# Patient Record
Sex: Male | Born: 1950 | Race: White | Hispanic: No | Marital: Married | Smoking: Current every day smoker
Health system: Southern US, Community
[De-identification: ages and names within clinical notes are randomized; demographics above are authoritative.]

## PROBLEM LIST (undated history)

## (undated) DIAGNOSIS — M1990 Unspecified osteoarthritis, unspecified site: Secondary | ICD-10-CM

## (undated) DIAGNOSIS — E119 Type 2 diabetes mellitus without complications: Secondary | ICD-10-CM

## (undated) DIAGNOSIS — K759 Inflammatory liver disease, unspecified: Secondary | ICD-10-CM

## (undated) DIAGNOSIS — I639 Cerebral infarction, unspecified: Secondary | ICD-10-CM

## (undated) DIAGNOSIS — E785 Hyperlipidemia, unspecified: Secondary | ICD-10-CM

## (undated) DIAGNOSIS — J449 Chronic obstructive pulmonary disease, unspecified: Secondary | ICD-10-CM

## (undated) DIAGNOSIS — G2581 Restless legs syndrome: Secondary | ICD-10-CM

## (undated) DIAGNOSIS — T8859XA Other complications of anesthesia, initial encounter: Secondary | ICD-10-CM

## (undated) DIAGNOSIS — I10 Essential (primary) hypertension: Secondary | ICD-10-CM

## (undated) DIAGNOSIS — R519 Headache, unspecified: Secondary | ICD-10-CM

## (undated) DIAGNOSIS — I219 Acute myocardial infarction, unspecified: Secondary | ICD-10-CM

## (undated) DIAGNOSIS — K760 Fatty (change of) liver, not elsewhere classified: Secondary | ICD-10-CM

## (undated) DIAGNOSIS — J45909 Unspecified asthma, uncomplicated: Secondary | ICD-10-CM

## (undated) DIAGNOSIS — I2510 Atherosclerotic heart disease of native coronary artery without angina pectoris: Secondary | ICD-10-CM

## (undated) HISTORY — PX: COLONOSCOPY: SHX174

## (undated) HISTORY — PX: TONSILLECTOMY: SUR1361

## (undated) HISTORY — PX: KNEE ARTHROSCOPY: SUR90

## (undated) HISTORY — DX: Hyperlipidemia, unspecified: E78.5

## (undated) HISTORY — PX: HERNIA REPAIR: SHX51

## (undated) HISTORY — DX: Unspecified asthma, uncomplicated: J45.909

## (undated) HISTORY — DX: Chronic obstructive pulmonary disease, unspecified: J44.9

## (undated) HISTORY — DX: Type 2 diabetes mellitus without complications: E11.9

## (undated) HISTORY — PX: OTHER SURGICAL HISTORY: SHX169

---

## 2016-11-17 HISTORY — PX: CORONARY ARTERY BYPASS GRAFT: SHX141

## 2017-02-09 ENCOUNTER — Encounter (HOSPITAL_COMMUNITY)
Admission: RE | Admit: 2017-02-09 | Discharge: 2017-02-09 | Disposition: A | Discharge status: Home / Self Care | Payer: BLUE CROSS/BLUE SHIELD | Source: Ambulatory Visit | Attending: Thoracic Surgery (Cardiothoracic Vascular Surgery) | Admitting: Thoracic Surgery (Cardiothoracic Vascular Surgery)

## 2017-02-09 DIAGNOSIS — I10 Essential (primary) hypertension: Secondary | ICD-10-CM | POA: Diagnosis not present

## 2017-02-09 DIAGNOSIS — Z7984 Long term (current) use of oral hypoglycemic drugs: Secondary | ICD-10-CM | POA: Insufficient documentation

## 2017-02-09 DIAGNOSIS — Z951 Presence of aortocoronary bypass graft: Secondary | ICD-10-CM | POA: Insufficient documentation

## 2017-02-09 DIAGNOSIS — Z79891 Long term (current) use of opiate analgesic: Secondary | ICD-10-CM | POA: Diagnosis not present

## 2017-02-09 DIAGNOSIS — Z7982 Long term (current) use of aspirin: Secondary | ICD-10-CM | POA: Insufficient documentation

## 2017-02-09 DIAGNOSIS — I214 Non-ST elevation (NSTEMI) myocardial infarction: Secondary | ICD-10-CM | POA: Insufficient documentation

## 2017-02-09 DIAGNOSIS — Z79899 Other long term (current) drug therapy: Secondary | ICD-10-CM | POA: Diagnosis not present

## 2017-02-09 NOTE — Progress Notes (Signed)
Cardiac Individual Treatment Plan  Patient Details  Name: Jeffrey Matthews MRN: [id number] Date of Birth: 02-06-1951 Referring Provider:     CARDIAC REHAB PHASE II ORIENTATION from 02/09/2017 in [facility] CARDIAC REHABILITATION  Referring Provider  Dr. Ty Hilts      Initial Encounter Date:    CARDIAC REHAB PHASE II ORIENTATION from 02/09/2017 in [place] CARDIAC REHABILITATION  Date  02/09/17  Referring Provider  Dr. Ty Hilts      Visit Diagnosis: No diagnosis found.  Patient's Home Medications on Admission:  Current Outpatient Prescriptions:  .  aspirin EC 81 MG tablet, Take 81 mg by mouth every morning., Disp: , Rfl:  .  atorvastatin (LIPITOR) 80 MG tablet, Take 80 mg by mouth every morning., Disp: , Rfl:  .  clopidogrel (PLAVIX) 75 MG tablet, Take 75 mg by mouth every morning., Disp: , Rfl:  .  lisinopril (PRINIVIL,ZESTRIL) 5 MG tablet, Take 5 mg by mouth daily., Disp: , Rfl:  .  metoprolol tartrate (LOPRESSOR) 25 MG tablet, Take 37.5 mg by mouth 2 (two) times daily., Disp: , Rfl:  .  oxyCODONE (OXY IR/ROXICODONE) 5 MG immediate release tablet, Take 5 mg by mouth every 4 (four) hours as needed for moderate pain or severe pain., Disp: , Rfl:  .  cyclobenzaprine (FLEXERIL) 10 MG tablet, Take 10 mg by mouth at bedtime as needed., Disp: , Rfl:  .  metFORMIN (GLUCOPHAGE-XR) 500 MG 24 hr tablet, Take 2,000 mg by mouth daily., Disp: , Rfl:  .  pramipexole (MIRAPEX) 0.75 MG tablet, Take 0.75 mg by mouth at bedtime., Disp: , Rfl:  .  testosterone cypionate (DEPOTESTOSTERONE CYPIONATE) 200 MG/ML injection, Inject 100 mg into the muscle every 14 (fourteen) days., Disp: , Rfl:   Past Medical History: No past medical history on file.  Tobacco Use: History  Smoking Status  . Not on file  Smokeless Tobacco  . Not on file    Labs: Recent Review Flowsheet Data    There is no flowsheet data to display.      Capillary Blood Glucose: No results found for: GLUCAP   Exercise Target  Goals: Date: 02/09/17  Exercise Program Goal: Individual exercise prescription set with THRR, safety & activity barriers. Participant demonstrates ability to understand and report RPE using BORG scale, to self-measure pulse accurately, and to acknowledge the importance of the exercise prescription.  Exercise Prescription Goal: Starting with aerobic activity 30 plus minutes a day, 3 days per week for initial exercise prescription. Provide home exercise prescription and guidelines that participant acknowledges understanding prior to discharge.  Activity Barriers & Risk Stratification:   6 Minute Walk:     6 Minute Walk    Row Name 02/09/17 1419         6 Minute Walk   Phase Initial     Distance 1550 feet     Distance % Change 0 %     Walk Time 6 minutes     # of Rest Breaks 0     MPH 2.93     METS 3.25     RPE 15     Perceived Dyspnea  14     VO2 Peak 13.22     Symptoms No     Resting HR 77 bpm     Resting BP 130/76     Max Ex. HR 100 bpm     Max Ex. BP 164/88     2 Minute Post BP 140/80  Oxygen Initial Assessment:     Oxygen Initial Assessment - 02/09/17 1507      Home Oxygen   Home Oxygen Device None   Sleep Oxygen Prescription None   Home Exercise Oxygen Prescription None   Home at Rest Exercise Oxygen Prescription None   Compliance with Home Oxygen Use No     Program Oxygen Prescription   Program Oxygen Prescription None      Oxygen Re-Evaluation:   Oxygen Discharge (Final Oxygen Re-Evaluation):   Initial Exercise Prescription:     Initial Exercise Prescription - 02/09/17 1400      Date of Initial Exercise RX and Referring Provider   Date 02/09/17   Referring Provider Dr. Ty Hilts     NuStep   Level 2   SPM 21   Minutes 20   METs 2     Recumbant Elliptical   Level 1   RPM 42   Watts 46   Minutes 15   METs 2.4     Prescription Details   Frequency (times per week) 3   Duration Progress to 30 minutes of continuous aerobic  without signs/symptoms of physical distress     Intensity   THRR 40-80% of Max Heartrate [phone]   Ratings of Perceived Exertion 11-13   Perceived Dyspnea 0-4     Progression   Progression Continue progressive overload as per policy without signs/symptoms or physical distress.     Resistance Training   Training Prescription Yes   Weight 1   Reps 10-15      Perform Capillary Blood Glucose checks as needed.  Exercise Prescription Changes:   Exercise Comments:   Exercise Goals and Review:      Exercise Goals    Row Name 02/09/17 1508             Exercise Goals   Increase Physical Activity Yes       Intervention Provide advice, education, support and counseling about physical activity/exercise needs.;Develop an individualized exercise prescription for aerobic and resistive training based on initial evaluation findings, risk stratification, comorbidities and participant's personal goals.       Expected Outcomes Achievement of increased cardiorespiratory fitness and enhanced flexibility, muscular endurance and strength shown through measurements of functional capacity and personal statement of participant.       Increase Strength and Stamina Yes       Intervention Provide advice, education, support and counseling about physical activity/exercise needs.;Develop an individualized exercise prescription for aerobic and resistive training based on initial evaluation findings, risk stratification, comorbidities and participant's personal goals.       Expected Outcomes Achievement of increased cardiorespiratory fitness and enhanced flexibility, muscular endurance and strength shown through measurements of functional capacity and personal statement of participant.          Exercise Goals Re-Evaluation :    Discharge Exercise Prescription (Final Exercise Prescription Changes):   Nutrition:  Target Goals: Understanding of nutrition guidelines, daily intake of sodium 1500mg ,  cholesterol 200mg , calories 30% from fat and 7% or less from saturated fats, daily to have 5 or more servings of fruits and vegetables.  Biometrics:     Pre Biometrics - 02/09/17 1421      Pre Biometrics   Height 6\' 1"  (1.854 m)   Weight 227 lb 1.2 oz (103 kg)   Waist Circumference 42.5 inches   Hip Circumference 42 inches   Waist to Hip Ratio 1.01 %   BMI (Calculated) 30   Triceps Skinfold 6 mm   %  Body Fat 25.3 %   Grip Strength 102 kg   Flexibility 12 in   Single Leg Stand 13 seconds       Nutrition Therapy Plan and Nutrition Goals:   Nutrition Discharge: Rate Your Plate Scores:   Nutrition Goals Re-Evaluation:   Nutrition Goals Discharge (Final Nutrition Goals Re-Evaluation):   Psychosocial: Target Goals: Acknowledge presence or absence of significant depression and/or stress, maximize coping skills, provide positive support system. Participant is able to verbalize types and ability to use techniques and skills needed for reducing stress and depression.  Initial Review & Psychosocial Screening:     Initial Psych Review & Screening - 02/09/17 1511      Initial Review   Current issues with None Identified     Family Dynamics   Good Support System? Yes     Barriers   Psychosocial barriers to participate in program There are no identifiable barriers or psychosocial needs.     Screening Interventions   Interventions Encouraged to exercise      Quality of Life Scores:     Quality of Life - 02/09/17 1422      Quality of Life Scores   Health/Function Pre 19.87 %   Socioeconomic Pre 15.5 %   Psych/Spiritual Pre 21.93 %   Family Pre 19.2 %   GLOBAL Pre 19.87 %      PHQ-9: Recent Review Flowsheet Data    Depression screen [facility] 2/9 02/09/2017   Decreased Interest 0   Down, Depressed, Hopeless 0   PHQ - 2 Score 0   Altered sleeping 0   Tired, decreased energy 1   Change in appetite 0   Feeling bad or failure about yourself  0   Trouble  concentrating 0   Moving slowly or fidgety/restless 0   Suicidal thoughts 0   PHQ-9 Score 1   Difficult doing work/chores Not difficult at all     Interpretation of Total Score  Total Score Depression Severity:  1-4 = Minimal depression, 5-9 = Mild depression, 10-14 = Moderate depression, 15-19 = Moderately severe depression, 20-27 = Severe depression   Psychosocial Evaluation and Intervention:     Psychosocial Evaluation - 02/09/17 1511      Psychosocial Evaluation & Interventions   Interventions Encouraged to exercise with the program and follow exercise prescription   Continue Psychosocial Services  No Follow up required      Psychosocial Re-Evaluation:   Psychosocial Discharge (Final Psychosocial Re-Evaluation):   Vocational Rehabilitation: Provide vocational rehab assistance to qualifying candidates.   Vocational Rehab Evaluation & Intervention:     Vocational Rehab - 02/09/17 1506      Initial Vocational Rehab Evaluation & Intervention   Assessment shows need for Vocational Rehabilitation No      Education: Education Goals: Education classes will be provided on a weekly basis, covering required topics. Participant will state understanding/return demonstration of topics presented.  Learning Barriers/Preferences:     Learning Barriers/Preferences - 02/09/17 1504      Learning Barriers/Preferences   Learning Barriers None   Learning Preferences Group Instruction;Individual Instruction;Skilled Demonstration      Education Topics: Hypertension, Hypertension Reduction -Define heart disease and high blood pressure. Discus how high blood pressure affects the body and ways to reduce high blood pressure.   Exercise and Your Heart -Discuss why it is important to exercise, the FITT principles of exercise, normal and abnormal responses to exercise, and how to exercise safely.   Angina -Discuss definition of angina, causes of angina,  treatment of angina, and  how to decrease risk of having angina.   Cardiac Medications -Review what the following cardiac medications are used for, how they affect the body, and side effects that may occur when taking the medications.  Medications include Aspirin, Beta blockers, calcium channel blockers, ACE Inhibitors, angiotensin receptor blockers, diuretics, digoxin, and antihyperlipidemics.   Congestive Heart Failure -Discuss the definition of CHF, how to live with CHF, the signs and symptoms of CHF, and how keep track of weight and sodium intake.   Heart Disease and Intimacy -Discus the effect sexual activity has on the heart, how changes occur during intimacy as we age, and safety during sexual activity.   Smoking Cessation / COPD -Discuss different methods to quit smoking, the health benefits of quitting smoking, and the definition of COPD.   Nutrition I: Fats -Discuss the types of cholesterol, what cholesterol does to the heart, and how cholesterol levels can be controlled.   Nutrition II: Labels -Discuss the different components of food labels and how to read food label   Heart Parts and Heart Disease -Discuss the anatomy of the heart, the pathway of blood circulation through the heart, and these are affected by heart disease.   Stress I: Signs and Symptoms -Discuss the causes of stress, how stress may lead to anxiety and depression, and ways to limit stress.   Stress II: Relaxation -Discuss different types of relaxation techniques to limit stress.   Warning Signs of Stroke / TIA -Discuss definition of a stroke, what the signs and symptoms are of a stroke, and how to identify when someone is having stroke.   Knowledge Questionnaire Score:     Knowledge Questionnaire Score - 02/09/17 1506      Knowledge Questionnaire Score   Pre Score 26/28      Core Components/Risk Factors/Patient Goals at Admission:     Personal Goals and Risk Factors at Admission - 02/09/17 1508      Core  Components/Risk Factors/Patient Goals on Admission    Weight Management Weight Maintenance   Personal Goal Other Yes   Personal Goal Get back to yardwork, play golf, and live a long life.   Intervention Attend CR 3x week and supplement with exercise at home 2 x week.   Expected Outcomes To reach personal goals      Core Components/Risk Factors/Patient Goals Review:      Goals and Risk Factor Review    Row Name 02/09/17 1511             Core Components/Risk Factors/Patient Goals Review   Personal Goals Review Weight Management/Obesity          Core Components/Risk Factors/Patient Goals at Discharge (Final Review):      Goals and Risk Factor Review - 02/09/17 1511      Core Components/Risk Factors/Patient Goals Review   Personal Goals Review Weight Management/Obesity      ITP Comments:   Comments: Patient arrived for 1st visit/orientation/education at 1230. Patient was referred to CR by Dr. 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 During orientation advised patient on arrival and appointment times what to wear, what to do before, during and after exercise. Reviewed attendance and class policy. Talked about inclement weather and class consultation policy. Pt is scheduled to return Cardiac Rehab on 02/16/17 at 1545. Pt was advised to come to class 15 minutes before class starts. Patient was also given instructions on meeting with the dietician and attending the Family Structure classes. Pt is eager  to get started. Patient participated in warm up stretches followed by light weights and resistance bands. Patient was able to complete 6 minute walk test. Patient was measured for the equipment. Discussed equipment safety with patient. Took patient pre-anthropometric measurements. Patient finished visit at 1445.

## 2017-02-09 NOTE — Progress Notes (Signed)
Cardiac/Pulmonary Rehab Medication Review by Matthews Pharmacist  Does the patient  feel that his/her medications are working for him/her?  yes  Has the patient been experiencing any side effects to the medications prescribed?  no  Does the patient measure his/her own blood pressure or blood glucose at home?  yes   Does the patient have any problems obtaining medications due to transportation or finances?   no  Understanding of regimen: fair Understanding of indications: fair Potential of compliance: good  Questions asked to Determine Patient Understanding of Medication Regimen:  1. What is the name of the medication?  2. What is the medication used for?  3. When should it be taken?  4. How much should be taken?  5. How will you take it?  6. What side effects should you report?  Understanding Defined as: Excellent: All questions above are correct Good: Questions 1-4 are correct Fair: Questions 1-2 are correct  Poor: 1 or none of the above questions are correct   Pharmacist comments: Pt does not c/o any side effects from medications.  Med list updated with Rx bottles from [facility].  Pt states he has no allergies.  Pt also states he will start checking his BP and sugar daily and keep Matthews journal to show MD at appointments.    Jeffrey Matthews, Jeffrey Matthews

## 2017-02-16 ENCOUNTER — Encounter (HOSPITAL_COMMUNITY): Payer: BLUE CROSS/BLUE SHIELD

## 2017-02-16 ENCOUNTER — Encounter (HOSPITAL_COMMUNITY): Payer: Self-pay

## 2017-02-18 ENCOUNTER — Encounter (HOSPITAL_COMMUNITY)
Admission: RE | Admit: 2017-02-18 | Discharge: 2017-02-18 | Disposition: A | Discharge status: Home / Self Care | Payer: BLUE CROSS/BLUE SHIELD | Source: Ambulatory Visit | Attending: Thoracic Surgery (Cardiothoracic Vascular Surgery) | Admitting: Thoracic Surgery (Cardiothoracic Vascular Surgery)

## 2017-02-18 DIAGNOSIS — Z7982 Long term (current) use of aspirin: Secondary | ICD-10-CM | POA: Insufficient documentation

## 2017-02-18 DIAGNOSIS — Z7984 Long term (current) use of oral hypoglycemic drugs: Secondary | ICD-10-CM | POA: Diagnosis not present

## 2017-02-18 DIAGNOSIS — I10 Essential (primary) hypertension: Secondary | ICD-10-CM | POA: Diagnosis not present

## 2017-02-18 DIAGNOSIS — Z79899 Other long term (current) drug therapy: Secondary | ICD-10-CM | POA: Diagnosis not present

## 2017-02-18 DIAGNOSIS — Z79891 Long term (current) use of opiate analgesic: Secondary | ICD-10-CM | POA: Diagnosis not present

## 2017-02-18 DIAGNOSIS — I214 Non-ST elevation (NSTEMI) myocardial infarction: Secondary | ICD-10-CM | POA: Diagnosis present

## 2017-02-18 DIAGNOSIS — Z951 Presence of aortocoronary bypass graft: Secondary | ICD-10-CM

## 2017-02-18 LAB — GLUCOSE, CAPILLARY: Glucose-Capillary: 218 mg/dL — ABNORMAL HIGH (ref 65–99)

## 2017-02-18 NOTE — Progress Notes (Signed)
Cardiac Individual Treatment Plan  Patient Details  Name: Jeffrey Matthews MRN: [id number] Date of Birth: [birth year] Referring Provider:     CARDIAC REHAB PHASE II ORIENTATION from 02/09/2017 in [facility] CARDIAC REHABILITATION  Referring Provider  Dr. Ty Hilts      Initial Encounter Date:    CARDIAC REHAB PHASE II ORIENTATION from 02/09/2017 in [place] CARDIAC REHABILITATION  Date  02/09/17  Referring Provider  Dr. Ty Hilts      Visit Diagnosis: S/P CABG x 3  NSTEMI (non-ST elevated myocardial infarction) [facility])  Patient's Home Medications on Admission:  Current Outpatient Prescriptions:  .  aspirin EC 81 MG tablet, Take 81 mg by mouth every morning., Disp: , Rfl:  .  atorvastatin (LIPITOR) 80 MG tablet, Take 80 mg by mouth every morning., Disp: , Rfl:  .  clopidogrel (PLAVIX) 75 MG tablet, Take 75 mg by mouth every morning., Disp: , Rfl:  .  cyclobenzaprine (FLEXERIL) 10 MG tablet, Take 10 mg by mouth at bedtime as needed., Disp: , Rfl:  .  lisinopril (PRINIVIL,ZESTRIL) 5 MG tablet, Take 5 mg by mouth daily., Disp: , Rfl:  .  metFORMIN (GLUCOPHAGE-XR) 500 MG 24 hr tablet, Take 2,000 mg by mouth daily., Disp: , Rfl:  .  metoprolol tartrate (LOPRESSOR) 25 MG tablet, Take 37.5 mg by mouth 2 (two) times daily., Disp: , Rfl:  .  oxyCODONE (OXY IR/ROXICODONE) 5 MG immediate release tablet, Take 5 mg by mouth every 4 (four) hours as needed for moderate pain or severe pain., Disp: , Rfl:  .  pramipexole (MIRAPEX) 0.75 MG tablet, Take 0.75 mg by mouth at bedtime., Disp: , Rfl:  .  testosterone cypionate (DEPOTESTOSTERONE CYPIONATE) 200 MG/ML injection, Inject 100 mg into the muscle every 14 (fourteen) days., Disp: , Rfl:   Past Medical History: No past medical history on file.  Tobacco Use: History  Smoking Status  . Not on file  Smokeless Tobacco  . Not on file    Labs: Recent Review Flowsheet Data    There is no flowsheet data to display.      Capillary Blood Glucose: No  results found for: GLUCAP   Exercise Target Goals:    Exercise Program Goal: Individual exercise prescription set with THRR, safety & activity barriers. Participant demonstrates ability to understand and report RPE using BORG scale, to self-measure pulse accurately, and to acknowledge the importance of the exercise prescription.  Exercise Prescription Goal: Starting with aerobic activity 30 plus minutes a day, 3 days per week for initial exercise prescription. Provide home exercise prescription and guidelines that participant acknowledges understanding prior to discharge.  Activity Barriers & Risk Stratification:   6 Minute Walk:     6 Minute Walk    Row Name 02/09/17 1419         6 Minute Walk   Phase Initial     Distance 1550 feet     Distance % Change 0 %     Walk Time 6 minutes     # of Rest Breaks 0     MPH 2.93     METS 3.25     RPE 15     Perceived Dyspnea  14     VO2 Peak 13.22     Symptoms No     Resting HR 77 bpm     Resting BP 130/76     Max Ex. HR 100 bpm     Max Ex. BP 164/88     2 Minute Post BP  140/80        Oxygen Initial Assessment:     Oxygen Initial Assessment - 02/09/17 1507      Home Oxygen   Home Oxygen Device None   Sleep Oxygen Prescription None   Home Exercise Oxygen Prescription None   Home at Rest Exercise Oxygen Prescription None   Compliance with Home Oxygen Use No     Program Oxygen Prescription   Program Oxygen Prescription None      Oxygen Re-Evaluation:   Oxygen Discharge (Final Oxygen Re-Evaluation):   Initial Exercise Prescription:     Initial Exercise Prescription - 02/09/17 1400      Date of Initial Exercise RX and Referring Provider   Date 02/09/17   Referring Provider Dr. Ty Hilts     NuStep   Level 2   SPM 21   Minutes 20   METs 2     Recumbant Elliptical   Level 1   RPM 42   Watts 46   Minutes 15   METs 2.4     Prescription Details   Frequency (times per week) 3   Duration Progress to 30  minutes of continuous aerobic without signs/symptoms of physical distress     Intensity   THRR 40-80% of Max Heartrate [phone]   Ratings of Perceived Exertion 11-13   Perceived Dyspnea 0-4     Progression   Progression Continue progressive overload as per policy without signs/symptoms or physical distress.     Resistance Training   Training Prescription Yes   Weight 1   Reps 10-15      Perform Capillary Blood Glucose checks as needed.  Exercise Prescription Changes:   Exercise Comments:     Exercise Comments    Row Name 02/17/17 1301           Exercise Comments Patient has not started CR yet from initial orientation.           Exercise Goals and Review:     Exercise Goals    Row Name 02/09/17 1508             Exercise Goals   Increase Physical Activity Yes       Intervention Provide advice, education, support and counseling about physical activity/exercise needs.;Develop an individualized exercise prescription for aerobic and resistive training based on initial evaluation findings, risk stratification, comorbidities and participant's personal goals.       Expected Outcomes Achievement of increased cardiorespiratory fitness and enhanced flexibility, muscular endurance and strength shown through measurements of functional capacity and personal statement of participant.       Increase Strength and Stamina Yes       Intervention Provide advice, education, support and counseling about physical activity/exercise needs.;Develop an individualized exercise prescription for aerobic and resistive training based on initial evaluation findings, risk stratification, comorbidities and participant's personal goals.       Expected Outcomes Achievement of increased cardiorespiratory fitness and enhanced flexibility, muscular endurance and strength shown through measurements of functional capacity and personal statement of participant.          Exercise Goals Re-Evaluation  :    Discharge Exercise Prescription (Final Exercise Prescription Changes):   Nutrition:  Target Goals: Understanding of nutrition guidelines, daily intake of sodium 1500mg , cholesterol 200mg , calories 30% from fat and 7% or less from saturated fats, daily to have 5 or more servings of fruits and vegetables.  Biometrics:     Pre Biometrics - 02/09/17 1421      Pre Biometrics  Height  (1.854 m)   Weight 227 lb 1.2 oz (103 kg)   Waist Circumference 42.5 inches   Hip Circumference 42 inches   Waist to Hip Ratio 1.01 %   BMI (Calculated) 30   Triceps Skinfold 6 mm   % Body Fat 25.3 %   Grip Strength 102 kg   Flexibility 12 in   Single Leg Stand 13 seconds       Nutrition Therapy Plan and Nutrition Goals:   Nutrition Discharge: Rate Your Plate Scores:   Nutrition Goals Re-Evaluation:   Nutrition Goals Discharge (Final Nutrition Goals Re-Evaluation):   Psychosocial: Target Goals: Acknowledge presence or absence of significant depression and/or stress, maximize coping skills, provide positive support system. Participant is able to verbalize types and ability to use techniques and skills needed for reducing stress and depression.  Initial Review & Psychosocial Screening:     Initial Psych Review & Screening - 02/09/17 1511      Initial Review   Current issues with None Identified     Family Dynamics   Good Support System? Yes     Barriers   Psychosocial barriers to participate in program There are no identifiable barriers or psychosocial needs.     Screening Interventions   Interventions Encouraged to exercise      Quality of Life Scores:     Quality of Life - 02/09/17 1422      Quality of Life Scores   Health/Function Pre 19.87 %   Socioeconomic Pre 15.5 %   Psych/Spiritual Pre 21.93 %   Family Pre 19.2 %   GLOBAL Pre 19.87 %      PHQ-9: Recent Review Flowsheet Data    Depression screen [facility] 2/9 02/09/2017   Decreased Interest 0    Down, Depressed, Hopeless 0   PHQ - 2 Score 0   Altered sleeping 0   Tired, decreased energy 1   Change in appetite 0   Feeling bad or failure about yourself  0   Trouble concentrating 0   Moving slowly or fidgety/restless 0   Suicidal thoughts 0   PHQ-9 Score 1   Difficult doing work/chores Not difficult at all     Interpretation of Total Score  Total Score Depression Severity:  1-4 = Minimal depression, 5-9 = Mild depression, 10-14 = Moderate depression, 15-19 = Moderately severe depression, 20-27 = Severe depression   Psychosocial Evaluation and Intervention:     Psychosocial Evaluation - 02/09/17 1511      Psychosocial Evaluation & Interventions   Interventions Encouraged to exercise with the program and follow exercise prescription   Continue Psychosocial Services  No Follow up required      Psychosocial Re-Evaluation:   Psychosocial Discharge (Final Psychosocial Re-Evaluation):   Vocational Rehabilitation: Provide vocational rehab assistance to qualifying candidates.   Vocational Rehab Evaluation & Intervention:     Vocational Rehab - 02/09/17 1506      Initial Vocational Rehab Evaluation & Intervention   Assessment shows need for Vocational Rehabilitation No      Education: Education Goals: Education classes will be provided on a weekly basis, covering required topics. Participant will state understanding/return demonstration of topics presented.  Learning Barriers/Preferences:     Learning Barriers/Preferences - 02/09/17 1504      Learning Barriers/Preferences   Learning Barriers None   Learning Preferences Group Instruction;Individual Instruction;Skilled Demonstration      Education Topics: Hypertension, Hypertension Reduction -Define heart disease and high blood pressure. Discus how high blood pressure affects  the body and ways to reduce high blood pressure.   Exercise and Your Heart -Discuss why it is important to exercise, the FITT  principles of exercise, normal and abnormal responses to exercise, and how to exercise safely.   Angina -Discuss definition of angina, causes of angina, treatment of angina, and how to decrease risk of having angina.   Cardiac Medications -Review what the following cardiac medications are used for, how they affect the body, and side effects that may occur when taking the medications.  Medications include Aspirin, Beta blockers, calcium channel blockers, ACE Inhibitors, angiotensin receptor blockers, diuretics, digoxin, and antihyperlipidemics.   Congestive Heart Failure -Discuss the definition of CHF, how to live with CHF, the signs and symptoms of CHF, and how keep track of weight and sodium intake.   Heart Disease and Intimacy -Discus the effect sexual activity has on the heart, how changes occur during intimacy as we age, and safety during sexual activity.   Smoking Cessation / COPD -Discuss different methods to quit smoking, the health benefits of quitting smoking, and the definition of COPD.   Nutrition I: Fats -Discuss the types of cholesterol, what cholesterol does to the heart, and how cholesterol levels can be controlled.   Nutrition II: Labels -Discuss the different components of food labels and how to read food label   Heart Parts and Heart Disease -Discuss the anatomy of the heart, the pathway of blood circulation through the heart, and these are affected by heart disease.   Stress I: Signs and Symptoms -Discuss the causes of stress, how stress may lead to anxiety and depression, and ways to limit stress.   Stress II: Relaxation -Discuss different types of relaxation techniques to limit stress.   Warning Signs of Stroke / TIA -Discuss definition of a stroke, what the signs and symptoms are of a stroke, and how to identify when someone is having stroke.   Knowledge Questionnaire Score:     Knowledge Questionnaire Score - 02/09/17 1506      Knowledge  Questionnaire Score   Pre Score 26/28      Core Components/Risk Factors/Patient Goals at Admission:     Personal Goals and Risk Factors at Admission - 02/09/17 1508      Core Components/Risk Factors/Patient Goals on Admission    Weight Management Weight Maintenance   Personal Goal Other Yes   Personal Goal Get back to yardwork, play golf, and live a long life.   Intervention Attend CR 3x week and supplement with exercise at home 2 x week.   Expected Outcomes To reach personal goals      Core Components/Risk Factors/Patient Goals Review:      Goals and Risk Factor Review    Row Name 02/09/17 1511             Core Components/Risk Factors/Patient Goals Review   Personal Goals Review Weight Management/Obesity          Core Components/Risk Factors/Patient Goals at Discharge (Final Review):      Goals and Risk Factor Review - 02/09/17 1511      Core Components/Risk Factors/Patient Goals Review   Personal Goals Review Weight Management/Obesity      ITP Comments:     ITP Comments    Row Name 02/18/17 1252           ITP Comments Patient new to program. Plans to start today 02/18/17.          Comments: ITP 30 Day REVIEW  Patient new to  program. Plans to start today 02/18/17.

## 2017-02-18 NOTE — Progress Notes (Signed)
Daily Session Note  Patient Details  Name: Jeffrey Matthews MRN: [id number] Date of Birth: 1951-10-19 Referring Provider:     CARDIAC REHAB PHASE II ORIENTATION from 02/09/2017 in [place]  Referring Provider  Dr. Lunette Stands      Encounter Date: 02/18/2017  Check In:     Session Check In - 02/18/17 1545      Check-In   Location [facility]   Staff Present Aundra Dubin, RN, BSN;Matsue Strom Luther Parody, BS, EP, Exercise Physiologist   Supervising physician immediately available to respond to emergencies See telemetry face sheet for immediately available MD   Medication changes reported     No   Fall or balance concerns reported    No   Warm-up and Cool-down Performed as group-led instruction   Resistance Training Performed Yes   VAD Patient? No     Pain Assessment   Currently in Pain? No/denies   Pain Score 0-No pain   Multiple Pain Sites No      Capillary Blood Glucose: No results found for this or any previous visit (from the past 24 hour(s)).    History  Smoking Status  . Not on file  Smokeless Tobacco  . Not on file    Goals Met:  Independence with exercise equipment Exercise tolerated well No report of cardiac concerns or symptoms Strength training completed today  Goals Unmet:  Not Applicable  Comments: Check out 445   Dr. Kate Sable is Medical Director for [place] and Pulmonary Rehab.

## 2017-02-20 ENCOUNTER — Encounter (HOSPITAL_COMMUNITY)
Admission: RE | Admit: 2017-02-20 | Discharge: 2017-02-20 | Disposition: A | Discharge status: Home / Self Care | Payer: BLUE CROSS/BLUE SHIELD | Source: Ambulatory Visit | Attending: Thoracic Surgery (Cardiothoracic Vascular Surgery) | Admitting: Thoracic Surgery (Cardiothoracic Vascular Surgery)

## 2017-02-20 DIAGNOSIS — I214 Non-ST elevation (NSTEMI) myocardial infarction: Secondary | ICD-10-CM

## 2017-02-20 DIAGNOSIS — Z951 Presence of aortocoronary bypass graft: Secondary | ICD-10-CM

## 2017-02-20 NOTE — Progress Notes (Signed)
Daily Session Note  Patient Details  Name: Jeffrey Matthews MRN: [id number] Date of Birth: 1951-09-25 Referring Provider:     CARDIAC REHAB PHASE II ORIENTATION from 02/09/2017 in [place]  Referring Provider  Dr. Lunette Stands      Encounter Date: 02/20/2017  Check In:     Session Check In - 02/20/17 1545      Check-In   Location [facility]   Staff Present Diane Angelina Pih, MS, EP, [facility], Exercise Physiologist;Zarriah Starkel Wynetta Emery, RN, BSN   Supervising physician immediately available to respond to emergencies See telemetry face sheet for immediately available MD   Medication changes reported     No   Fall or balance concerns reported    No   Warm-up and Cool-down Performed as group-led instruction   Resistance Training Performed Yes   VAD Patient? No     Pain Assessment   Currently in Pain? No/denies   Pain Score 0-No pain   Multiple Pain Sites No      Capillary Blood Glucose: No results found for this or any previous visit (from the past 24 hour(s)).    History  Smoking Status  . Not on file  Smokeless Tobacco  . Not on file    Goals Met:  Independence with exercise equipment Exercise tolerated well No report of cardiac concerns or symptoms Strength training completed today  Goals Unmet:  Not Applicable  Comments: Check out 1645.   Dr. Kate Sable is Medical Director for [facility] Cardiac and Pulmonary Rehab.

## 2017-02-23 ENCOUNTER — Encounter (HOSPITAL_COMMUNITY)
Admission: RE | Admit: 2017-02-23 | Discharge: 2017-02-23 | Disposition: A | Discharge status: Home / Self Care | Payer: BLUE CROSS/BLUE SHIELD | Source: Ambulatory Visit | Attending: Thoracic Surgery (Cardiothoracic Vascular Surgery) | Admitting: Thoracic Surgery (Cardiothoracic Vascular Surgery)

## 2017-02-23 DIAGNOSIS — I214 Non-ST elevation (NSTEMI) myocardial infarction: Secondary | ICD-10-CM | POA: Diagnosis not present

## 2017-02-25 ENCOUNTER — Encounter (HOSPITAL_COMMUNITY)
Admission: RE | Admit: 2017-02-25 | Discharge: 2017-02-25 | Disposition: A | Discharge status: Home / Self Care | Payer: BLUE CROSS/BLUE SHIELD | Source: Ambulatory Visit | Attending: Thoracic Surgery (Cardiothoracic Vascular Surgery) | Admitting: Thoracic Surgery (Cardiothoracic Vascular Surgery)

## 2017-02-25 DIAGNOSIS — Z951 Presence of aortocoronary bypass graft: Secondary | ICD-10-CM

## 2017-02-25 DIAGNOSIS — I214 Non-ST elevation (NSTEMI) myocardial infarction: Secondary | ICD-10-CM | POA: Diagnosis not present

## 2017-02-25 NOTE — Progress Notes (Signed)
Daily Session Note  Patient Details  Name: Jeffrey Matthews MRN: [id number] Date of Birth: 12-Oct-1951 Referring Provider:     CARDIAC REHAB PHASE II ORIENTATION from 02/09/2017 in [place]  Referring Provider  Dr. Lunette Stands      Encounter Date: 02/25/2017  Check In:     Session Check In - 02/25/17 1545      Check-In   Location [facility]   Staff Present Diane Angelina Pih, MS, EP, [facility], Exercise Physiologist;Audrionna Lampton Wynetta Emery, RN, BSN   Supervising physician immediately available to respond to emergencies See telemetry face sheet for immediately available MD   Medication changes reported     No   Fall or balance concerns reported    No   Warm-up and Cool-down Performed as group-led instruction   Resistance Training Performed Yes   VAD Patient? No     Pain Assessment   Currently in Pain? No/denies   Pain Score 0-No pain   Multiple Pain Sites No      Capillary Blood Glucose: No results found for this or any previous visit (from the past 24 hour(s)).    History  Smoking Status  . Not on file  Smokeless Tobacco  . Not on file    Goals Met:  Independence with exercise equipment Exercise tolerated well No report of cardiac concerns or symptoms Strength training completed today  Goals Unmet:  Not Applicable  Comments: Check out 1645.   Dr. Kate Sable is Medical Director for [facility] Cardiac and Pulmonary Rehab.

## 2017-02-27 ENCOUNTER — Encounter (HOSPITAL_COMMUNITY)
Admission: RE | Admit: 2017-02-27 | Discharge: 2017-02-27 | Disposition: A | Discharge status: Home / Self Care | Payer: BLUE CROSS/BLUE SHIELD | Source: Ambulatory Visit | Attending: Thoracic Surgery (Cardiothoracic Vascular Surgery) | Admitting: Thoracic Surgery (Cardiothoracic Vascular Surgery)

## 2017-02-27 DIAGNOSIS — Z951 Presence of aortocoronary bypass graft: Secondary | ICD-10-CM

## 2017-02-27 DIAGNOSIS — I214 Non-ST elevation (NSTEMI) myocardial infarction: Secondary | ICD-10-CM

## 2017-02-27 NOTE — Progress Notes (Signed)
Daily Session Note  Patient Details  Name: Jeffrey Matthews MRN: [id number] Date of Birth: [birth year] Referring Provider:     CARDIAC REHAB PHASE II ORIENTATION from 02/09/2017 in [place]  Referring Provider  Jeffrey Matthews      Encounter Date: 02/27/2017  Check In:     Session Check In - 02/27/17 1545      Check-In   Location [facility]   Staff Present Jeffrey Angelina Pih, MS, EP, [facility], Exercise Physiologist;Jeffrey Banta Wynetta Emery, RN, BSN   Supervising physician immediately available to respond to emergencies See telemetry face sheet for immediately available MD   Medication changes reported     No   Fall or balance concerns reported    No   Warm-up and Cool-down Performed as group-led instruction   Resistance Training Performed Yes   VAD Patient? No     Pain Assessment   Currently in Pain? No/denies   Pain Score 0-No pain   Multiple Pain Sites No      Capillary Blood Glucose: No results found for this or any previous visit (from the past 24 hour(s)).    History  Smoking Status  . Not on file  Smokeless Tobacco  . Not on file    Goals Met:  Independence with exercise equipment Exercise tolerated well No report of cardiac concerns or symptoms Strength training completed today  Goals Unmet:  Not Applicable  Comments: Check out 1645.   Jeffrey Matthews is Medical Director for [facility] Cardiac and Pulmonary Rehab.

## 2017-03-02 ENCOUNTER — Encounter (HOSPITAL_COMMUNITY)
Admission: RE | Admit: 2017-03-02 | Discharge: 2017-03-02 | Disposition: A | Discharge status: Home / Self Care | Payer: BLUE CROSS/BLUE SHIELD | Source: Ambulatory Visit | Attending: Thoracic Surgery (Cardiothoracic Vascular Surgery) | Admitting: Thoracic Surgery (Cardiothoracic Vascular Surgery)

## 2017-03-02 DIAGNOSIS — I214 Non-ST elevation (NSTEMI) myocardial infarction: Secondary | ICD-10-CM

## 2017-03-02 DIAGNOSIS — Z951 Presence of aortocoronary bypass graft: Secondary | ICD-10-CM

## 2017-03-02 NOTE — Progress Notes (Signed)
Daily Session Note  Patient Details  Name: Jeffrey Matthews MRN: [id number] Date of Birth: [birth year] Referring Provider:     CARDIAC REHAB PHASE II ORIENTATION from 02/09/2017 in [place]  Referring Provider  Dr. Lunette Stands      Encounter Date: 03/02/2017  Check In:     Session Check In - 03/02/17 1545      Check-In   Location [facility]   Staff Present Aundra Dubin, RN, BSN;Gregory Cowan, BS, EP, Exercise Physiologist   Medication changes reported     No   Fall or balance concerns reported    No   Warm-up and Cool-down Performed as group-led instruction   Resistance Training Performed Yes   VAD Patient? No     Pain Assessment   Currently in Pain? No/denies   Pain Score 0-No pain   Multiple Pain Sites No      Capillary Blood Glucose: No results found for this or any previous visit (from the past 24 hour(s)).    History  Smoking Status  . Not on file  Smokeless Tobacco  . Not on file    Goals Met:  Independence with exercise equipment Exercise tolerated well No report of cardiac concerns or symptoms Strength training completed today  Goals Unmet:  Not Applicable  Comments: Check out 1645.   Dr. Kate Sable is Medical Director for [facility] Cardiac and Pulmonary Rehab.

## 2017-03-04 ENCOUNTER — Encounter (HOSPITAL_COMMUNITY)
Admission: RE | Admit: 2017-03-04 | Discharge: 2017-03-04 | Disposition: A | Discharge status: Home / Self Care | Payer: BLUE CROSS/BLUE SHIELD | Source: Ambulatory Visit | Attending: Thoracic Surgery (Cardiothoracic Vascular Surgery) | Admitting: Thoracic Surgery (Cardiothoracic Vascular Surgery)

## 2017-03-04 DIAGNOSIS — I214 Non-ST elevation (NSTEMI) myocardial infarction: Secondary | ICD-10-CM | POA: Diagnosis not present

## 2017-03-04 DIAGNOSIS — Z951 Presence of aortocoronary bypass graft: Secondary | ICD-10-CM

## 2017-03-04 NOTE — Progress Notes (Signed)
Daily Session Note  Patient Details  Name: BERYLE ZEITZ MRN: [id number] Date of Birth: 06-Nov-1951 Referring Provider:     CARDIAC REHAB PHASE II ORIENTATION from 02/09/2017 in [place]  Referring Provider  Dr. Lunette Stands      Encounter Date: 03/04/2017  Check In:     Session Check In - 03/04/17 1537      Check-In   Location [facility]   Staff Present Aundra Dubin, RN, BSN;Gregory Luther Parody, BS, EP, Exercise Physiologist   Supervising physician immediately available to respond to emergencies See telemetry face sheet for immediately available MD   Medication changes reported     No   Fall or balance concerns reported    No   Warm-up and Cool-down Performed as group-led instruction   Resistance Training Performed Yes   VAD Patient? No     Pain Assessment   Currently in Pain? No/denies   Pain Score 0-No pain   Multiple Pain Sites No      Capillary Blood Glucose: No results found for this or any previous visit (from the past 24 hour(s)).    History  Smoking Status  . Not on file  Smokeless Tobacco  . Not on file    Goals Met:  Independence with exercise equipment Exercise tolerated well No report of cardiac concerns or symptoms Strength training completed today  Goals Unmet:  Not Applicable  Comments: Check out 1645.   Dr. Kate Sable is Medical Director for [facility] Cardiac and Pulmonary Rehab.

## 2017-03-06 ENCOUNTER — Encounter (HOSPITAL_COMMUNITY)
Admission: RE | Admit: 2017-03-06 | Discharge: 2017-03-06 | Disposition: A | Discharge status: Home / Self Care | Payer: BLUE CROSS/BLUE SHIELD | Source: Ambulatory Visit | Attending: Thoracic Surgery (Cardiothoracic Vascular Surgery) | Admitting: Thoracic Surgery (Cardiothoracic Vascular Surgery)

## 2017-03-06 DIAGNOSIS — I214 Non-ST elevation (NSTEMI) myocardial infarction: Secondary | ICD-10-CM

## 2017-03-06 DIAGNOSIS — Z951 Presence of aortocoronary bypass graft: Secondary | ICD-10-CM

## 2017-03-06 NOTE — Progress Notes (Signed)
Daily Session Note  Patient Details  Name: Jeffrey Matthews MRN: [id number] Date of Birth: 1951/10/21 Referring Provider:     CARDIAC REHAB PHASE II ORIENTATION from 02/09/2017 in [place]  Referring Provider  Dr. Lunette Stands      Encounter Date: 03/06/2017  Check In:     Session Check In - 03/06/17 1545      Check-In   Location [facility]   Staff Present Suzanne Boron, BS, EP, Exercise Physiologist;Tallie Dodds Wynetta Emery, RN, BSN   Supervising physician immediately available to respond to emergencies See telemetry face sheet for immediately available MD   Medication changes reported     No   Fall or balance concerns reported    No   Warm-up and Cool-down Performed as group-led instruction   Resistance Training Performed Yes   VAD Patient? No     Pain Assessment   Currently in Pain? No/denies   Pain Score 0-No pain   Multiple Pain Sites No      Capillary Blood Glucose: No results found for this or any previous visit (from the past 24 hour(s)).      Exercise Prescription Changes - 03/06/17 1500      Response to Exercise   Blood Pressure (Admit) 146/90   Blood Pressure (Exercise) 180/80   Blood Pressure (Exit) 154/84   Heart Rate (Admit) 66 bpm   Heart Rate (Exercise) 87 bpm   Heart Rate (Exit) 76 bpm   Rating of Perceived Exertion (Exercise) 12   Duration Progress to 30 minutes of  aerobic without signs/symptoms of physical distress   Intensity THRR unchanged     Progression   Progression Continue to progress workloads to maintain intensity without signs/symptoms of physical distress.     Resistance Training   Training Prescription Yes   Weight 1   Reps 10-15     NuStep   Level 2   SPM 52   Minutes 20   METs 3.69     Recumbant Elliptical   Level 1   RPM 53   Watts 64   Minutes 15   METs 3.5     Home Exercise Plan   Plans to continue exercise at Home (comment)   Frequency Add 2 additional days to program exercise  sessions.      History  Smoking Status  . Not on file  Smokeless Tobacco  . Not on file    Goals Met:  Independence with exercise equipment Exercise tolerated well No report of cardiac concerns or symptoms Strength training completed today  Goals Unmet:  Not Applicable  Comments: Check out 1645.   Dr. Kate Sable is Medical Director for [facility] Cardiac and Pulmonary Rehab.

## 2017-03-09 ENCOUNTER — Encounter (HOSPITAL_COMMUNITY)
Admission: RE | Admit: 2017-03-09 | Discharge: 2017-03-09 | Disposition: A | Discharge status: Home / Self Care | Payer: BLUE CROSS/BLUE SHIELD | Source: Ambulatory Visit | Attending: Thoracic Surgery (Cardiothoracic Vascular Surgery) | Admitting: Thoracic Surgery (Cardiothoracic Vascular Surgery)

## 2017-03-09 DIAGNOSIS — I214 Non-ST elevation (NSTEMI) myocardial infarction: Secondary | ICD-10-CM | POA: Diagnosis not present

## 2017-03-09 DIAGNOSIS — Z951 Presence of aortocoronary bypass graft: Secondary | ICD-10-CM

## 2017-03-09 NOTE — Progress Notes (Deleted)
Daily Session Note  Patient Details  Name: Jeffrey Matthews MRN: [id number] Date of Birth: January 21, 1951 Referring Provider:     CARDIAC REHAB PHASE II ORIENTATION from 02/09/2017 in [place]  Referring Provider  Dr. Lunette Stands      Encounter Date: 03/09/2017  Check In:     Session Check In - 03/09/17 1545      Check-In   Location [facility]   Staff Present Diane Angelina Pih, MS, EP, [facility], Exercise Physiologist;Voncile Schwarz Wynetta Emery, RN, BSN   Supervising physician immediately available to respond to emergencies See telemetry face sheet for immediately available MD   Medication changes reported     No   Fall or balance concerns reported    No   Warm-up and Cool-down Performed as group-led instruction   Resistance Training Performed Yes   VAD Patient? No     Pain Assessment   Currently in Pain? No/denies   Pain Score 0-No pain   Multiple Pain Sites No      Capillary Blood Glucose: No results found for this or any previous visit (from the past 24 hour(s)).    History  Smoking Status  . Not on file  Smokeless Tobacco  . Not on file    Goals Met:  Independence with exercise equipment Exercise tolerated well No report of cardiac concerns or symptoms Strength training completed today  Goals Unmet:  Not Applicable  Comments: Check out 1645.   Dr. Kate Sable is Medical Director for [facility] Cardiac and Pulmonary Rehab.

## 2017-03-09 NOTE — Progress Notes (Signed)
Daily Session Note  Patient Details  Name: Jeffrey Matthews MRN: [id number] Date of Birth: [birth year] Referring Provider:     CARDIAC REHAB PHASE II ORIENTATION from 02/09/2017 in [place]  Referring Provider  Dr. Lunette Stands      Encounter Date: 03/09/2017  Check In:     Session Check In - 03/09/17 1545      Check-In   Location [facility]   Staff Present Diane Angelina Pih, MS, EP, [facility], Exercise Physiologist;Aleric Froelich Wynetta Emery, RN, BSN   Supervising physician immediately available to respond to emergencies See telemetry face sheet for immediately available MD   Medication changes reported     No   Fall or balance concerns reported    No   Tobacco Cessation No Change   Warm-up and Cool-down Performed as group-led instruction   Resistance Training Performed Yes   VAD Patient? No     Pain Assessment   Currently in Pain? No/denies   Pain Score 0-No pain   Multiple Pain Sites No      Capillary Blood Glucose: No results found for this or any previous visit (from the past 24 hour(s)).    History  Smoking Status  . Not on file  Smokeless Tobacco  . Not on file    Goals Met:  Independence with exercise equipment Exercise tolerated well No report of cardiac concerns or symptoms Strength training completed today  Goals Unmet:  Not Applicable  Comments: Check out 1645.    Dr. Kate Sable is Medical Director for [facility] Cardiac and Pulmonary Rehab.

## 2017-03-09 NOTE — Progress Notes (Signed)
Cardiac Individual Treatment Plan  Patient Details  Name: Jeffrey Matthews MRN: [id number] Date of Birth: 14-Sep-1951 Referring Provider:     CARDIAC REHAB PHASE II ORIENTATION from 02/09/2017 in [place]  Referring Provider  Dr. Lunette Stands      Initial Encounter Date:    CARDIAC REHAB PHASE II ORIENTATION from 02/09/2017 in [place]  Date  02/09/17  Referring Provider  Dr. Lunette Stands      Visit Diagnosis: S/P CABG x 3  NSTEMI (non-ST elevated myocardial infarction) [facility])  Patient's Home Medications on Admission:  Current Outpatient Prescriptions:  .  aspirin EC 81 MG tablet, Take 81 mg by mouth every morning., Disp: , Rfl:  .  atorvastatin (LIPITOR) 80 MG tablet, Take 80 mg by mouth every morning., Disp: , Rfl:  .  clopidogrel (PLAVIX) 75 MG tablet, Take 75 mg by mouth every morning., Disp: , Rfl:  .  cyclobenzaprine (FLEXERIL) 10 MG tablet, Take 10 mg by mouth at bedtime as needed., Disp: , Rfl:  .  lisinopril (PRINIVIL,ZESTRIL) 5 MG tablet, Take 5 mg by mouth daily., Disp: , Rfl:  .  metFORMIN (GLUCOPHAGE-XR) 500 MG 24 hr tablet, Take 2,000 mg by mouth daily., Disp: , Rfl:  .  metoprolol tartrate (LOPRESSOR) 25 MG tablet, Take 37.5 mg by mouth 2 (two) times daily., Disp: , Rfl:  .  oxyCODONE (OXY IR/ROXICODONE) 5 MG immediate release tablet, Take 5 mg by mouth every 4 (four) hours as needed for moderate pain or severe pain., Disp: , Rfl:  .  pramipexole (MIRAPEX) 0.75 MG tablet, Take 0.75 mg by mouth at bedtime., Disp: , Rfl:  .  testosterone cypionate (DEPOTESTOSTERONE CYPIONATE) 200 MG/ML injection, Inject 100 mg into the muscle every 14 (fourteen) days., Disp: , Rfl:   Past Medical History: No past medical history on file.  Tobacco Use: History  Smoking Status  . Not on file  Smokeless Tobacco  . Not on file    Labs: Recent Review Flowsheet Data    There is no flowsheet data to display.      Capillary Blood  Glucose: Lab Results  Component Value Date   GLUCAP 218 (H) 02/18/2017     Exercise Target Goals:    Exercise Program Goal: Individual exercise prescription set with THRR, safety & activity barriers. Participant demonstrates ability to understand and report RPE using BORG scale, to self-measure pulse accurately, and to acknowledge the importance of the exercise prescription.  Exercise Prescription Goal: Starting with aerobic activity 30 plus minutes a day, 3 days per week for initial exercise prescription. Provide home exercise prescription and guidelines that participant acknowledges understanding prior to discharge.  Activity Barriers & Risk Stratification:   6 Minute Walk:     6 Minute Walk    Row Name 02/09/17 1419         6 Minute Walk   Phase Initial     Distance 1550 feet     Distance % Change 0 %     Walk Time 6 minutes     # of Rest Breaks 0     MPH 2.93     METS 3.25     RPE 15     Perceived Dyspnea  14     VO2 Peak 13.22     Symptoms No     Resting HR 77 bpm     Resting BP 130/76     Max Ex. HR 100 bpm     Max Ex. BP  164/88     2 Minute Post BP 140/80        Oxygen Initial Assessment:     Oxygen Initial Assessment - 02/09/17 1507      Home Oxygen   Home Oxygen Device None   Sleep Oxygen Prescription None   Home Exercise Oxygen Prescription None   Home at Rest Exercise Oxygen Prescription None   Compliance with Home Oxygen Use No     Program Oxygen Prescription   Program Oxygen Prescription None      Oxygen Re-Evaluation:   Oxygen Discharge (Final Oxygen Re-Evaluation):   Initial Exercise Prescription:     Initial Exercise Prescription - 02/09/17 1400      Date of Initial Exercise RX and Referring Provider   Date 02/09/17   Referring Provider Dr. Lunette Stands     NuStep   Level 2   SPM 21   Minutes 20   METs 2     Recumbant Elliptical   Level 1   RPM 42   Watts 46   Minutes 15   METs 2.4     Prescription Details    Frequency (times per week) 3   Duration Progress to 30 minutes of continuous aerobic without signs/symptoms of physical distress     Intensity   THRR 40-80% of Max Heartrate [phone]   Ratings of Perceived Exertion 11-13   Perceived Dyspnea 0-4     Progression   Progression Continue progressive overload as per policy without signs/symptoms or physical distress.     Resistance Training   Training Prescription Yes   Weight 1   Reps 10-15      Perform Capillary Blood Glucose checks as needed.  Exercise Prescription Changes:      Exercise Prescription Changes    Row Name 03/06/17 1500             Response to Exercise   Blood Pressure (Admit) 146/90       Blood Pressure (Exercise) 180/80       Blood Pressure (Exit) 154/84       Heart Rate (Admit) 66 bpm       Heart Rate (Exercise) 87 bpm       Heart Rate (Exit) 76 bpm       Rating of Perceived Exertion (Exercise) 12       Duration Progress to 30 minutes of  aerobic without signs/symptoms of physical distress       Intensity THRR unchanged         Progression   Progression Continue to progress workloads to maintain intensity without signs/symptoms of physical distress.         Resistance Training   Training Prescription Yes       Weight 1       Reps 10-15         NuStep   Level 2       SPM 52       Minutes 20       METs 3.69         Recumbant Elliptical   Level 1       RPM 53       Watts 64       Minutes 15       METs 3.5         Home Exercise Plan   Plans to continue exercise at Home (comment)       Frequency Add 2 additional days to program exercise sessions.  Exercise Comments:      Exercise Comments    Row Name 02/17/17 1301 03/06/17 1520         Exercise Comments Patient has not started CR yet from initial orientation.  Patient is progressing well in CR. He is progressing watts and maintaining levels.          Exercise Goals and Review:      Exercise Goals    Row Name  02/09/17 1508             Exercise Goals   Increase Physical Activity Yes       Intervention Provide advice, education, support and counseling about physical activity/exercise needs.;Develop an individualized exercise prescription for aerobic and resistive training based on initial evaluation findings, risk stratification, comorbidities and participant's personal goals.       Expected Outcomes Achievement of increased cardiorespiratory fitness and enhanced flexibility, muscular endurance and strength shown through measurements of functional capacity and personal statement of participant.       Increase Strength and Stamina Yes       Intervention Provide advice, education, support and counseling about physical activity/exercise needs.;Develop an individualized exercise prescription for aerobic and resistive training based on initial evaluation findings, risk stratification, comorbidities and participant's personal goals.       Expected Outcomes Achievement of increased cardiorespiratory fitness and enhanced flexibility, muscular endurance and strength shown through measurements of functional capacity and personal statement of participant.          Exercise Goals Re-Evaluation :     Exercise Goals Re-Evaluation    Row Name 03/09/17 1342             Exercise Goal Re-Evaluation   Exercise Goals Review Increase Physical Activity;Increase Strenth and Stamina  Do yardwork; live a long life.       Comments After 9 sessions, patient is progressing well in the program with increased strength. He is starting do do some yardwork again and plans to return to full time work 03/16/17.        Expected Outcomes Patient will hopefully be able to adjust his work schedule to continue to attend sessions completing the program.            Discharge Exercise Prescription (Final Exercise Prescription Changes):     Exercise Prescription Changes - 03/06/17 1500      Response to Exercise   Blood  Pressure (Admit) 146/90   Blood Pressure (Exercise) 180/80   Blood Pressure (Exit) 154/84   Heart Rate (Admit) 66 bpm   Heart Rate (Exercise) 87 bpm   Heart Rate (Exit) 76 bpm   Rating of Perceived Exertion (Exercise) 12   Duration Progress to 30 minutes of  aerobic without signs/symptoms of physical distress   Intensity THRR unchanged     Progression   Progression Continue to progress workloads to maintain intensity without signs/symptoms of physical distress.     Resistance Training   Training Prescription Yes   Weight 1   Reps 10-15     NuStep   Level 2   SPM 52   Minutes 20   METs 3.69     Recumbant Elliptical   Level 1   RPM 53   Watts 64   Minutes 15   METs 3.5     Home Exercise Plan   Plans to continue exercise at Home (comment)   Frequency Add 2 additional days to program exercise sessions.      Nutrition:  Target Goals: Understanding of  nutrition guidelines, daily intake of sodium '1500mg'$ , cholesterol '200mg'$ , calories 30% from fat and 7% or less from saturated fats, daily to have 5 or more servings of fruits and vegetables.  Biometrics:     Pre Biometrics - 02/09/17 1421      Pre Biometrics   Height '6\' 1"'$  (1.854 m)   Weight 227 lb 1.2 oz (103 kg)   Waist Circumference 42.5 inches   Hip Circumference 42 inches   Waist to Hip Ratio 1.01 %   BMI (Calculated) 30   Triceps Skinfold 6 mm   % Body Fat 25.3 %   Grip Strength 102 kg   Flexibility 12 in   Single Leg Stand 13 seconds       Nutrition Therapy Plan and Nutrition Goals:   Nutrition Discharge: Rate Your Plate Scores:   Nutrition Goals Re-Evaluation:   Nutrition Goals Discharge (Final Nutrition Goals Re-Evaluation):   Psychosocial: Target Goals: Acknowledge presence or absence of significant depression and/or stress, maximize coping skills, provide positive support system. Participant is able to verbalize types and ability to use techniques and skills needed for reducing stress and  depression.  Initial Review & Psychosocial Screening:     Initial Psych Review & Screening - 02/09/17 1511      Initial Review   Current issues with None Identified     Family Dynamics   Good Support System? Yes     Barriers   Psychosocial barriers to participate in program There are no identifiable barriers or psychosocial needs.     Screening Interventions   Interventions Encouraged to exercise      Quality of Life Scores:     Quality of Life - 02/09/17 1422      Quality of Life Scores   Health/Function Pre 19.87 %   Socioeconomic Pre 15.5 %   Psych/Spiritual Pre 21.93 %   Family Pre 19.2 %   GLOBAL Pre 19.87 %      PHQ-9: Recent Review Flowsheet Data    Depression screen [facility] 2/9 02/09/2017   Decreased Interest 0   Down, Depressed, Hopeless 0   PHQ - 2 Score 0   Altered sleeping 0   Tired, decreased energy 1   Change in appetite 0   Feeling bad or failure about yourself  0   Trouble concentrating 0   Moving slowly or fidgety/restless 0   Suicidal thoughts 0   PHQ-9 Score 1   Difficult doing work/chores Not difficult at all     Interpretation of Total Score  Total Score Depression Severity:  1-4 = Minimal depression, 5-9 = Mild depression, 10-14 = Moderate depression, 15-19 = Moderately severe depression, 20-27 = Severe depression   Psychosocial Evaluation and Intervention:     Psychosocial Evaluation - 02/09/17 1511      Psychosocial Evaluation & Interventions   Interventions Encouraged to exercise with the program and follow exercise prescription   Continue Psychosocial Services  No Follow up required      Psychosocial Re-Evaluation:   Psychosocial Discharge (Final Psychosocial Re-Evaluation):   Vocational Rehabilitation: Provide vocational rehab assistance to qualifying candidates.   Vocational Rehab Evaluation & Intervention:     Vocational Rehab - 02/09/17 1506      Initial Vocational Rehab Evaluation & Intervention   Assessment  shows need for Vocational Rehabilitation No      Education: Education Goals: Education classes will be provided on a weekly basis, covering required topics. Participant will state understanding/return demonstration of topics presented.  Learning Barriers/Preferences:  Learning Barriers/Preferences - 02/09/17 1504      Learning Barriers/Preferences   Learning Barriers None   Learning Preferences Group Instruction;Individual Instruction;Skilled Demonstration      Education Topics: Hypertension, Hypertension Reduction -Define heart disease and high blood pressure. Discus how high blood pressure affects the body and ways to reduce high blood pressure.   Exercise and Your Heart -Discuss why it is important to exercise, the FITT principles of exercise, normal and abnormal responses to exercise, and how to exercise safely.   Angina -Discuss definition of angina, causes of angina, treatment of angina, and how to decrease risk of having angina.   Cardiac Medications -Review what the following cardiac medications are used for, how they affect the body, and side effects that may occur when taking the medications.  Medications include Aspirin, Beta blockers, calcium channel blockers, ACE Inhibitors, angiotensin receptor blockers, diuretics, digoxin, and antihyperlipidemics.   Congestive Heart Failure -Discuss the definition of CHF, how to live with CHF, the signs and symptoms of CHF, and how keep track of weight and sodium intake.   Heart Disease and Intimacy -Discus the effect sexual activity has on the heart, how changes occur during intimacy as we age, and safety during sexual activity.   Smoking Cessation / COPD -Discuss different methods to quit smoking, the health benefits of quitting smoking, and the definition of COPD.   Nutrition I: Fats -Discuss the types of cholesterol, what cholesterol does to the heart, and how cholesterol levels can be controlled.   Nutrition  II: Labels -Discuss the different components of food labels and how to read food label   Heart Parts and Heart Disease -Discuss the anatomy of the heart, the pathway of blood circulation through the heart, and these are affected by heart disease.   CARDIAC REHAB PHASE II EXERCISE from 03/04/2017 in [place] CARDIAC REHABILITATION  Date  02/18/17  Educator  DJ  Instruction Review Code  2- meets goals/outcomes      Stress I: Signs and Symptoms -Discuss the causes of stress, how stress may lead to anxiety and depression, and ways to limit stress.   CARDIAC REHAB PHASE II EXERCISE from 03/04/2017 in [facility] CARDIAC REHABILITATION  Date  02/25/17  Educator  D. Coad  Instruction Review Code  2- meets goals/outcomes      Stress II: Relaxation -Discuss different types of relaxation techniques to limit stress.   CARDIAC REHAB PHASE II EXERCISE from 03/04/2017 in [place] [facility] CARDIAC REHABILITATION  Date  03/04/17  Educator  GC  Instruction Review Code  2- meets goals/outcomes      Warning Signs of Stroke / TIA -Discuss definition of a stroke, what the signs and symptoms are of a stroke, and how to identify when someone is having stroke.   Knowledge Questionnaire Score:     Knowledge Questionnaire Score - 02/09/17 1506      Knowledge Questionnaire Score   Pre Score 26/28      Core Components/Risk Factors/Patient Goals at Admission:     Personal Goals and Risk Factors at Admission - 02/09/17 1508      Core Components/Risk Factors/Patient Goals on Admission    Weight Management Weight Maintenance   Personal Goal Other Yes   Personal Goal Get back to yardwork, play golf, and live a long life.   Intervention Attend CR 3x week and supplement with exercise at home 2 x week.   Expected Outcomes To reach personal goals      Core Components/Risk Factors/Patient Goals Review:  Goals and Risk Factor Review    Row Name 02/09/17 1511 03/09/17 1339           Core  Components/Risk Factors/Patient Goals Review   Personal Goals Review Weight Management/Obesity Hypertension;Diabetes;Weight Management/Obesity      Review  - Patient has completed 9 sessions gaining 6 lbs. He says he is trying to eat a healthier diet but needs to work on portions sizes. His b/p remains hypertensive. His cardiologist adjusted his medications last week.       Expected Outcomes  - Patient will complete the program meeting his personal goals.          Core Components/Risk Factors/Patient Goals at Discharge (Final Review):      Goals and Risk Factor Review - 03/09/17 1339      Core Components/Risk Factors/Patient Goals Review   Personal Goals Review Hypertension;Diabetes;Weight Management/Obesity   Review Patient has completed 9 sessions gaining 6 lbs. He says he is trying to eat a healthier diet but needs to work on portions sizes. His b/p remains hypertensive. His cardiologist adjusted his medications last week.    Expected Outcomes Patient will complete the program meeting his personal goals.       ITP Comments:     ITP Comments    Row Name 02/18/17 1252 02/19/17 1539         ITP Comments Patient new to program. Plans to start today 02/18/17. Patient met with Registered Dietitian to discuss nutrition topics including: Heart healthty eating, heart health cooking and make smart choices when shopping; Portion control; weight management; and hydration. Patient attended a group session with the hospital chaplian called [facility] to discuss and share how this recent diagnosis has effected their life         Comments: ITP 30 Day REVIEW Patient doing well in the program. Will continue to monitor for progress.

## 2017-03-11 ENCOUNTER — Encounter (HOSPITAL_COMMUNITY)
Admission: RE | Admit: 2017-03-11 | Discharge: 2017-03-11 | Disposition: A | Discharge status: Home / Self Care | Payer: BLUE CROSS/BLUE SHIELD | Source: Ambulatory Visit | Attending: Thoracic Surgery (Cardiothoracic Vascular Surgery) | Admitting: Thoracic Surgery (Cardiothoracic Vascular Surgery)

## 2017-03-11 DIAGNOSIS — Z951 Presence of aortocoronary bypass graft: Secondary | ICD-10-CM

## 2017-03-11 DIAGNOSIS — I214 Non-ST elevation (NSTEMI) myocardial infarction: Secondary | ICD-10-CM | POA: Diagnosis not present

## 2017-03-11 NOTE — Progress Notes (Signed)
Daily Session Note  Patient Details  Name: Jeffrey Matthews MRN: [id number] Date of Birth: February 11, 1951 Referring Provider:     CARDIAC REHAB PHASE II ORIENTATION from 02/09/2017 in [place]  Referring Provider  Dr. Lunette Stands      Encounter Date: 03/11/2017  Check In:     Session Check In - 03/11/17 1534      Check-In   Location [facility]   Staff Present Aundra Dubin, RN, BSN;Lambert Jeanty Luther Parody, BS, EP, Exercise Physiologist   Supervising physician immediately available to respond to emergencies See telemetry face sheet for immediately available MD   Medication changes reported     No   Fall or balance concerns reported    No   Warm-up and Cool-down Performed as group-led instruction   Resistance Training Performed Yes   VAD Patient? No     Pain Assessment   Currently in Pain? No/denies   Pain Score 0-No pain   Multiple Pain Sites No      Capillary Blood Glucose: No results found for this or any previous visit (from the past 24 hour(s)).    History  Smoking Status  . Not on file  Smokeless Tobacco  . Not on file    Goals Met:  Independence with exercise equipment Exercise tolerated well No report of cardiac concerns or symptoms Strength training completed today  Goals Unmet:  Not Applicable  Comments: Check out 445   Dr. Kate Sable is Medical Director for [place] and Pulmonary Rehab.

## 2017-03-12 ENCOUNTER — Encounter (HOSPITAL_COMMUNITY): Payer: BLUE CROSS/BLUE SHIELD

## 2017-03-13 ENCOUNTER — Encounter (HOSPITAL_COMMUNITY)
Admission: RE | Admit: 2017-03-13 | Discharge: 2017-03-13 | Disposition: A | Discharge status: Home / Self Care | Payer: BLUE CROSS/BLUE SHIELD | Source: Ambulatory Visit | Attending: Thoracic Surgery (Cardiothoracic Vascular Surgery) | Admitting: Thoracic Surgery (Cardiothoracic Vascular Surgery)

## 2017-03-13 DIAGNOSIS — I214 Non-ST elevation (NSTEMI) myocardial infarction: Secondary | ICD-10-CM | POA: Diagnosis not present

## 2017-03-13 DIAGNOSIS — Z951 Presence of aortocoronary bypass graft: Secondary | ICD-10-CM

## 2017-03-13 NOTE — Progress Notes (Signed)
Daily Session Note  Patient Details  Name: Jeffrey Matthews MRN: [id number] Date of Birth: 1951-03-19 Referring Provider:     CARDIAC REHAB PHASE II ORIENTATION from 02/09/2017 in [place]  Referring Provider  Dr. Lunette Stands      Encounter Date: 03/13/2017  Check In:     Session Check In - 03/13/17 1545      Check-In   Location [facility]   Staff Present Diane Angelina Pih, MS, EP, [facility] Exercise Physiologist;Isyss Espinal Wynetta Emery, RN, BSN   Supervising physician immediately available to respond to emergencies See telemetry face sheet for immediately available MD   Medication changes reported     No   Fall or balance concerns reported    No   Tobacco Cessation No Change   Warm-up and Cool-down Performed as group-led instruction   Resistance Training Performed Yes   VAD Patient? No     Pain Assessment   Currently in Pain? No/denies   Pain Score 0-No pain   Multiple Pain Sites No      Capillary Blood Glucose: No results found for this or any previous visit (from the past 24 hour(s)).    History  Smoking Status  . Not on file  Smokeless Tobacco  . Not on file    Goals Met:  Independence with exercise equipment Exercise tolerated well No report of cardiac concerns or symptoms Strength training completed today  Goals Unmet:  Not Applicable  Comments: Check out 1645.   Dr. Kate Sable is Medical Director for [facility] Cardiac and Pulmonary Rehab.

## 2017-03-16 ENCOUNTER — Encounter (HOSPITAL_COMMUNITY)
Admission: RE | Admit: 2017-03-16 | Discharge: 2017-03-16 | Disposition: A | Discharge status: Home / Self Care | Payer: BLUE CROSS/BLUE SHIELD | Source: Ambulatory Visit | Attending: Thoracic Surgery (Cardiothoracic Vascular Surgery) | Admitting: Thoracic Surgery (Cardiothoracic Vascular Surgery)

## 2017-03-16 DIAGNOSIS — Z951 Presence of aortocoronary bypass graft: Secondary | ICD-10-CM

## 2017-03-16 DIAGNOSIS — I214 Non-ST elevation (NSTEMI) myocardial infarction: Secondary | ICD-10-CM | POA: Diagnosis not present

## 2017-03-16 NOTE — Progress Notes (Signed)
Daily Session Note  Patient Details  Name: Jeffrey Matthews MRN: [id number] Date of Birth: Mar 25, 1951 Referring Provider:     CARDIAC REHAB PHASE II ORIENTATION from 02/09/2017 in [place]  Referring Provider  Dr. Lunette Stands      Encounter Date: 03/16/2017  Check In:     Session Check In - 03/16/17 1518      Check-In   Location [facility]   Staff Present Suzanne Boron, BS, EP, Exercise Physiologist   Supervising physician immediately available to respond to emergencies See telemetry face sheet for immediately available MD   Medication changes reported     No   Fall or balance concerns reported    No   Warm-up and Cool-down Performed as group-led instruction   Resistance Training Performed Yes   VAD Patient? No     Pain Assessment   Currently in Pain? No/denies   Pain Score 0-No pain   Multiple Pain Sites No      Capillary Blood Glucose: No results found for this or any previous visit (from the past 24 hour(s)).    History  Smoking Status  . Not on file  Smokeless Tobacco  . Not on file    Goals Met:  Independence with exercise equipment Exercise tolerated well No report of cardiac concerns or symptoms Strength training completed today  Goals Unmet:  Not Applicable  Comments: Check out 745   Dr. Kate Sable is Medical Director for [place] and Pulmonary Rehab.

## 2017-03-18 ENCOUNTER — Encounter (HOSPITAL_COMMUNITY)
Admission: RE | Admit: 2017-03-18 | Discharge: 2017-03-18 | Disposition: A | Discharge status: Home / Self Care | Payer: BLUE CROSS/BLUE SHIELD | Source: Ambulatory Visit | Attending: Thoracic Surgery (Cardiothoracic Vascular Surgery) | Admitting: Thoracic Surgery (Cardiothoracic Vascular Surgery)

## 2017-03-18 DIAGNOSIS — I10 Essential (primary) hypertension: Secondary | ICD-10-CM | POA: Insufficient documentation

## 2017-03-18 DIAGNOSIS — Z7982 Long term (current) use of aspirin: Secondary | ICD-10-CM | POA: Diagnosis not present

## 2017-03-18 DIAGNOSIS — I214 Non-ST elevation (NSTEMI) myocardial infarction: Secondary | ICD-10-CM

## 2017-03-18 DIAGNOSIS — Z951 Presence of aortocoronary bypass graft: Secondary | ICD-10-CM | POA: Diagnosis present

## 2017-03-18 DIAGNOSIS — Z79891 Long term (current) use of opiate analgesic: Secondary | ICD-10-CM | POA: Diagnosis not present

## 2017-03-18 DIAGNOSIS — Z79899 Other long term (current) drug therapy: Secondary | ICD-10-CM | POA: Insufficient documentation

## 2017-03-18 DIAGNOSIS — Z7984 Long term (current) use of oral hypoglycemic drugs: Secondary | ICD-10-CM | POA: Insufficient documentation

## 2017-03-18 NOTE — Progress Notes (Signed)
Daily Session Note  Patient Details  Name: KUTTER SCHNEPF MRN: [id number] Date of Birth: [birth year] Referring Provider:     CARDIAC REHAB PHASE II ORIENTATION from 02/09/2017 in [place]  Referring Provider  Dr. Lunette Stands      Encounter Date: 03/18/2017  Check In:     Session Check In - 03/18/17 0645      Check-In   Location [facility]   Staff Present Russella Dar, MS, EP, [facility], Exercise Physiologist;Nou Chard Wynetta Emery, RN, BSN   Supervising physician immediately available to respond to emergencies See telemetry face sheet for immediately available MD   Medication changes reported     No   Fall or balance concerns reported    No   Warm-up and Cool-down Performed as group-led instruction   Resistance Training Performed Yes   VAD Patient? No     Pain Assessment   Currently in Pain? No/denies   Pain Score 0-No pain   Multiple Pain Sites No      Capillary Blood Glucose: No results found for this or any previous visit (from the past 24 hour(s)).    History  Smoking Status  . Not on file  Smokeless Tobacco  . Not on file    Goals Met:  Independence with exercise equipment Exercise tolerated well No report of cardiac concerns or symptoms Strength training completed today  Goals Unmet:  Not Applicable  Comments: Check out 745.   Dr. Kate Sable is Medical Director for [facility] Cardiac and Pulmonary Rehab.

## 2017-03-20 ENCOUNTER — Encounter (HOSPITAL_COMMUNITY)
Admission: RE | Admit: 2017-03-20 | Discharge: 2017-03-20 | Disposition: A | Discharge status: Home / Self Care | Payer: BLUE CROSS/BLUE SHIELD | Source: Ambulatory Visit | Attending: Thoracic Surgery (Cardiothoracic Vascular Surgery) | Admitting: Thoracic Surgery (Cardiothoracic Vascular Surgery)

## 2017-03-20 DIAGNOSIS — I214 Non-ST elevation (NSTEMI) myocardial infarction: Secondary | ICD-10-CM | POA: Diagnosis not present

## 2017-03-20 NOTE — Progress Notes (Signed)
Daily Session Note  Patient Details  Name: Jeffrey Matthews MRN: [id number] Date of Birth: 1951/03/01 Referring Provider:     CARDIAC REHAB PHASE II ORIENTATION from 02/09/2017 in [place]  Referring Provider  Dr. Lunette Stands      Encounter Date: 03/20/2017  Check In:     Session Check In - 03/20/17 0645      Check-In   Location [facility]   Staff Present Russella Dar, MS, EP, [facility], Exercise Physiologist   Supervising physician immediately available to respond to emergencies See telemetry face sheet for immediately available MD   Medication changes reported     No   Fall or balance concerns reported    No   Tobacco Cessation No Change   Warm-up and Cool-down Performed as group-led instruction   Resistance Training Performed Yes   VAD Patient? No     Pain Assessment   Currently in Pain? No/denies   Multiple Pain Sites No      Capillary Blood Glucose: No results found for this or any previous visit (from the past 24 hour(s)).    History  Smoking Status  . Not on file  Smokeless Tobacco  . Not on file    Goals Met:  Independence with exercise equipment Exercise tolerated well No report of cardiac concerns or symptoms Strength training completed today  Goals Unmet:  Not Applicable  Comments: Check out: 0745   Dr. Kate Sable is Medical Director for [place] and Pulmonary Rehab.

## 2017-03-23 ENCOUNTER — Encounter (HOSPITAL_COMMUNITY)
Admission: RE | Admit: 2017-03-23 | Discharge: 2017-03-23 | Disposition: A | Discharge status: Home / Self Care | Payer: BLUE CROSS/BLUE SHIELD | Source: Ambulatory Visit | Attending: Thoracic Surgery (Cardiothoracic Vascular Surgery) | Admitting: Thoracic Surgery (Cardiothoracic Vascular Surgery)

## 2017-03-23 DIAGNOSIS — I214 Non-ST elevation (NSTEMI) myocardial infarction: Secondary | ICD-10-CM

## 2017-03-23 DIAGNOSIS — Z951 Presence of aortocoronary bypass graft: Secondary | ICD-10-CM

## 2017-03-23 NOTE — Progress Notes (Signed)
Daily Session Note  Patient Details  Name: JUSTYN LANGHAM MRN: [id number] Date of Birth: 10-30-1951 Referring Provider:     CARDIAC REHAB PHASE II ORIENTATION from 02/09/2017 in [place]  Referring Provider  Dr. Lunette Stands      Encounter Date: 03/23/2017  Check In:     Session Check In - 03/23/17 0645      Check-In   Location [facility]   Staff Present Russella Dar, MS, EP, [facility], Exercise Physiologist;Jahi Roza Wynetta Emery, RN, BSN   Supervising physician immediately available to respond to emergencies See telemetry face sheet for immediately available MD   Medication changes reported     No   Fall or balance concerns reported    No   Warm-up and Cool-down Performed as group-led instruction   Resistance Training Performed Yes   VAD Patient? No     Pain Assessment   Currently in Pain? No/denies   Pain Score 0-No pain   Multiple Pain Sites No      Capillary Blood Glucose: No results found for this or any previous visit (from the past 24 hour(s)).    History  Smoking Status  . Not on file  Smokeless Tobacco  . Not on file    Goals Met:  Independence with exercise equipment Exercise tolerated well No report of cardiac concerns or symptoms Strength training completed today  Goals Unmet:  Not Applicable  Comments: Check out 745.   Dr. Kate Sable is Medical Director for [facility] Cardiac and Pulmonary Rehab.

## 2017-03-25 ENCOUNTER — Encounter (HOSPITAL_COMMUNITY)
Admission: RE | Admit: 2017-03-25 | Discharge: 2017-03-25 | Disposition: A | Discharge status: Home / Self Care | Payer: BLUE CROSS/BLUE SHIELD | Source: Ambulatory Visit | Attending: Thoracic Surgery (Cardiothoracic Vascular Surgery) | Admitting: Thoracic Surgery (Cardiothoracic Vascular Surgery)

## 2017-03-25 DIAGNOSIS — I214 Non-ST elevation (NSTEMI) myocardial infarction: Secondary | ICD-10-CM | POA: Diagnosis not present

## 2017-03-25 DIAGNOSIS — Z951 Presence of aortocoronary bypass graft: Secondary | ICD-10-CM

## 2017-03-25 NOTE — Progress Notes (Signed)
Daily Session Note  Patient Details  Name: CARLITO BOGERT MRN: [id number] Date of Birth: 02-Apr-1951 Referring Provider:     CARDIAC REHAB PHASE II ORIENTATION from 02/09/2017 in [place]  Referring Provider  Dr. Lunette Stands      Encounter Date: 03/25/2017  Check In:     Session Check In - 03/25/17 1120      Check-In   Location [facility]   Staff Present Aundra Dubin, RN, BSN;Monai Hindes Luther Parody, BS, EP, Exercise Physiologist   Supervising physician immediately available to respond to emergencies See telemetry face sheet for immediately available MD   Medication changes reported     No   Fall or balance concerns reported    No   Warm-up and Cool-down Performed as group-led instruction   Resistance Training Performed Yes   VAD Patient? No     Pain Assessment   Currently in Pain? No/denies   Pain Score 0-No pain   Multiple Pain Sites No      Capillary Blood Glucose: No results found for this or any previous visit (from the past 24 hour(s)).    History  Smoking Status  . Not on file  Smokeless Tobacco  . Not on file    Goals Met:  Independence with exercise equipment Exercise tolerated well No report of cardiac concerns or symptoms Strength training completed today  Goals Unmet:  Not Applicable  Comments: Check out 1200   Dr. Kate Sable is Medical Director for [place] and Pulmonary Rehab.

## 2017-03-27 ENCOUNTER — Encounter (HOSPITAL_COMMUNITY)
Admission: RE | Admit: 2017-03-27 | Discharge: 2017-03-27 | Disposition: A | Discharge status: Home / Self Care | Payer: BLUE CROSS/BLUE SHIELD | Source: Ambulatory Visit | Attending: Thoracic Surgery (Cardiothoracic Vascular Surgery) | Admitting: Thoracic Surgery (Cardiothoracic Vascular Surgery)

## 2017-03-27 DIAGNOSIS — I214 Non-ST elevation (NSTEMI) myocardial infarction: Secondary | ICD-10-CM

## 2017-03-27 DIAGNOSIS — Z951 Presence of aortocoronary bypass graft: Secondary | ICD-10-CM

## 2017-03-27 NOTE — Progress Notes (Signed)
Daily Session Note  Patient Details  Name: Jeffrey Matthews MRN: [id number] Date of Birth: 03/11/1951 Referring Provider:     CARDIAC REHAB PHASE II ORIENTATION from 02/09/2017 in [place] CARDIAC RE[facility]ILITATION  Referring Provider  Dr. Kincaid      Encounter Date: 03/27/2017  Check In:     Session Check In - 03/27/17 1100      Check-In   Location [facility]   Staff Present Gregory Cowan, BS, EP, Exercise Physiologist;Debra Johnson, RN, BSN   Supervising physician immediately available to respond to emergencies See telemetry face sheet for immediately available MD   Medication changes reported     No   Fall or balance concerns reported    No   Warm-up and Cool-down Performed as group-led instruction   Resistance Training Performed Yes   VAD Patient? No     Pain Assessment   Currently in Pain? No/denies   Pain Score 0-No pain   Multiple Pain Sites No      Capillary Blood Glucose: No results found for this or any previous visit (from the past 24 hour(s)).    History  Smoking Status  . Not on file  Smokeless Tobacco  . Not on file    Goals Met:  Independence with exercise equipment Exercise tolerated well No report of cardiac concerns or symptoms Strength training completed today  Goals Unmet:  Not Applicable  Comments: Check out 1200.   Dr. Suresh Koneswaran is Medical Director for [place] Cardiac and Pulmonary Rehab. 

## 2017-03-30 ENCOUNTER — Encounter (HOSPITAL_COMMUNITY)
Admission: RE | Admit: 2017-03-30 | Discharge: 2017-03-30 | Disposition: A | Discharge status: Home / Self Care | Payer: BLUE CROSS/BLUE SHIELD | Source: Ambulatory Visit | Attending: Thoracic Surgery (Cardiothoracic Vascular Surgery) | Admitting: Thoracic Surgery (Cardiothoracic Vascular Surgery)

## 2017-03-30 DIAGNOSIS — I214 Non-ST elevation (NSTEMI) myocardial infarction: Secondary | ICD-10-CM | POA: Diagnosis not present

## 2017-04-01 ENCOUNTER — Encounter (HOSPITAL_COMMUNITY)
Admission: RE | Admit: 2017-04-01 | Discharge: 2017-04-01 | Disposition: A | Discharge status: Home / Self Care | Payer: BLUE CROSS/BLUE SHIELD | Source: Ambulatory Visit | Attending: Thoracic Surgery (Cardiothoracic Vascular Surgery) | Admitting: Thoracic Surgery (Cardiothoracic Vascular Surgery)

## 2017-04-01 DIAGNOSIS — Z951 Presence of aortocoronary bypass graft: Secondary | ICD-10-CM

## 2017-04-01 DIAGNOSIS — I214 Non-ST elevation (NSTEMI) myocardial infarction: Secondary | ICD-10-CM | POA: Diagnosis not present

## 2017-04-01 NOTE — Progress Notes (Signed)
Daily Session Note  Patient Details  Name: Jeffrey Matthews MRN: [id number] Date of Birth: 1950-11-25 Referring Provider:     CARDIAC REHAB PHASE II ORIENTATION from 02/09/2017 in [place]  Referring Provider  Dr. Lunette Stands      Encounter Date: 04/01/2017  Check In:     Session Check In - 04/01/17 1129      Check-In   Location [facility]   Staff Present Aundra Dubin, RN, BSN;Kenitra Leventhal Luther Parody, BS, EP, Exercise Physiologist   Supervising physician immediately available to respond to emergencies See telemetry face sheet for immediately available MD   Medication changes reported     No   Fall or balance concerns reported    No   Warm-up and Cool-down Performed as group-led instruction   Resistance Training Performed Yes   VAD Patient? No     Pain Assessment   Currently in Pain? No/denies   Pain Score 0-No pain   Multiple Pain Sites No      Capillary Blood Glucose: No results found for this or any previous visit (from the past 24 hour(s)).    History  Smoking Status  . Not on file  Smokeless Tobacco  . Not on file    Goals Met:  Independence with exercise equipment Exercise tolerated well No report of cardiac concerns or symptoms Strength training completed today  Goals Unmet:  Not Applicable  Comments: Check out 1200   Dr. Kate Sable is Medical Director for [place] and Pulmonary Rehab.

## 2017-04-03 ENCOUNTER — Encounter (HOSPITAL_COMMUNITY)
Admission: RE | Admit: 2017-04-03 | Discharge: 2017-04-03 | Disposition: A | Discharge status: Home / Self Care | Payer: BLUE CROSS/BLUE SHIELD | Source: Ambulatory Visit | Attending: Thoracic Surgery (Cardiothoracic Vascular Surgery) | Admitting: Thoracic Surgery (Cardiothoracic Vascular Surgery)

## 2017-04-03 DIAGNOSIS — Z951 Presence of aortocoronary bypass graft: Secondary | ICD-10-CM

## 2017-04-03 DIAGNOSIS — I214 Non-ST elevation (NSTEMI) myocardial infarction: Secondary | ICD-10-CM | POA: Diagnosis not present

## 2017-04-03 NOTE — Progress Notes (Signed)
Daily Session Note  Patient Details  Name: COWEN PESQUEIRA MRN: [id number] Date of Birth: April 23, 1951 Referring Provider:     CARDIAC REHAB PHASE II ORIENTATION from 02/09/2017 in [place]  Referring Provider  Dr. Lunette Stands      Encounter Date: 04/03/2017  Check In:     Session Check In - 04/03/17 1058      Check-In   Location [facility]   Staff Present Diane Angelina Pih, MS, EP, [facility], Exercise Physiologist;Shayne Deerman Luther Parody, BS, EP, Exercise Physiologist   Supervising physician immediately available to respond to emergencies See telemetry face sheet for immediately available MD   Medication changes reported     No   Fall or balance concerns reported    No   Warm-up and Cool-down Performed as group-led instruction   Resistance Training Performed Yes   VAD Patient? No     Pain Assessment   Currently in Pain? No/denies   Pain Score 0-No pain   Multiple Pain Sites No      Capillary Blood Glucose: No results found for this or any previous visit (from the past 24 hour(s)).    History  Smoking Status  . Not on file  Smokeless Tobacco  . Not on file    Goals Met:  Independence with exercise equipment Exercise tolerated well No report of cardiac concerns or symptoms Strength training completed today  Goals Unmet:  Not Applicable  Comments: Check out 1200   Dr. Kate Sable is Medical Director for [place] and Pulmonary Rehab.

## 2017-04-06 ENCOUNTER — Encounter (HOSPITAL_COMMUNITY)
Admission: RE | Admit: 2017-04-06 | Discharge: 2017-04-06 | Disposition: A | Discharge status: Home / Self Care | Payer: BLUE CROSS/BLUE SHIELD | Source: Ambulatory Visit | Attending: Thoracic Surgery (Cardiothoracic Vascular Surgery) | Admitting: Thoracic Surgery (Cardiothoracic Vascular Surgery)

## 2017-04-06 DIAGNOSIS — I214 Non-ST elevation (NSTEMI) myocardial infarction: Secondary | ICD-10-CM | POA: Diagnosis not present

## 2017-04-06 DIAGNOSIS — Z951 Presence of aortocoronary bypass graft: Secondary | ICD-10-CM

## 2017-04-06 NOTE — Progress Notes (Signed)
Daily Session Note  Patient Details  Name: Jeffrey Matthews MRN: [id number] Date of Birth: 03/15/1951 Referring Provider:     CARDIAC REHAB PHASE II ORIENTATION from 02/09/2017 in [place]  Referring Provider  Dr. Lunette Stands      Encounter Date: 04/06/2017  Check In:     Session Check In - 04/06/17 1136      Check-In   Location [facility]   Staff Present Diane Angelina Pih, MS, EP, [facility], Exercise Physiologist;Harris Penton Luther Parody, BS, EP, Exercise Physiologist   Supervising physician immediately available to respond to emergencies See telemetry face sheet for immediately available MD   Medication changes reported     No   Fall or balance concerns reported    No   Warm-up and Cool-down Performed as group-led instruction   Resistance Training Performed Yes   VAD Patient? No     Pain Assessment   Currently in Pain? No/denies   Pain Score 0-No pain   Multiple Pain Sites No      Capillary Blood Glucose: No results found for this or any previous visit (from the past 24 hour(s)).    History  Smoking Status  . Not on file  Smokeless Tobacco  . Not on file    Goals Met:  Independence with exercise equipment Exercise tolerated well No report of cardiac concerns or symptoms Strength training completed today  Goals Unmet:  Not Applicable  Comments: Check out 1200   Dr. Kate Sable is Medical Director for [place] and Pulmonary Rehab.

## 2017-04-08 ENCOUNTER — Encounter (HOSPITAL_COMMUNITY)
Admission: RE | Admit: 2017-04-08 | Discharge: 2017-04-08 | Disposition: A | Discharge status: Home / Self Care | Payer: BLUE CROSS/BLUE SHIELD | Source: Ambulatory Visit | Attending: Thoracic Surgery (Cardiothoracic Vascular Surgery) | Admitting: Thoracic Surgery (Cardiothoracic Vascular Surgery)

## 2017-04-08 DIAGNOSIS — I214 Non-ST elevation (NSTEMI) myocardial infarction: Secondary | ICD-10-CM | POA: Diagnosis not present

## 2017-04-08 DIAGNOSIS — Z951 Presence of aortocoronary bypass graft: Secondary | ICD-10-CM

## 2017-04-08 NOTE — Progress Notes (Signed)
Daily Session Note  Patient Details  Name: Jeffrey Matthews MRN: [id number] Date of Birth: 03/03/1951 Referring Provider:     CARDIAC REHAB PHASE II ORIENTATION from 02/09/2017 in [name]  Referring Provider  Dr. Lunette Stands      Encounter Date: 04/08/2017  Check In:     Session Check In - 04/08/17 1115      Check-In   Location [facility]   Staff Present Aundra Dubin, RN, BSN;Marilena Trevathan Luther Parody, BS, EP, Exercise Physiologist   Supervising physician immediately available to respond to emergencies See telemetry face sheet for immediately available MD   Medication changes reported     No   Fall or balance concerns reported    No   Warm-up and Cool-down Performed as group-led instruction   Resistance Training Performed Yes   VAD Patient? No     Pain Assessment   Currently in Pain? No/denies   Pain Score 0-No pain   Multiple Pain Sites No      Capillary Blood Glucose: No results found for this or any previous visit (from the past 24 hour(s)).    History  Smoking Status  . Not on file  Smokeless Tobacco  . Not on file    Goals Met:  Independence with exercise equipment Exercise tolerated well No report of cardiac concerns or symptoms Strength training completed today  Goals Unmet:  Not Applicable  Comments: Check out 1200   Dr. Kate Sable is Medical Director for [place] and Pulmonary Rehab.

## 2017-04-09 NOTE — Progress Notes (Signed)
Cardiac Individual Treatment Plan  Patient Details  Name: Jeffrey Matthews MRN: [id number] Date of Birth: 14-Sep-1951 Referring Provider:     CARDIAC REHAB PHASE II ORIENTATION from 02/09/2017 in [place]  Referring Provider  Dr. Lunette Stands      Initial Encounter Date:    CARDIAC REHAB PHASE II ORIENTATION from 02/09/2017 in [place]  Date  02/09/17  Referring Provider  Dr. Lunette Stands      Visit Diagnosis: S/P CABG x 3  NSTEMI (non-ST elevated myocardial infarction) [facility])  Patient's Home Medications on Admission:  Current Outpatient Prescriptions:  .  aspirin EC 81 MG tablet, Take 81 mg by mouth every morning., Disp: , Rfl:  .  atorvastatin (LIPITOR) 80 MG tablet, Take 80 mg by mouth every morning., Disp: , Rfl:  .  clopidogrel (PLAVIX) 75 MG tablet, Take 75 mg by mouth every morning., Disp: , Rfl:  .  cyclobenzaprine (FLEXERIL) 10 MG tablet, Take 10 mg by mouth at bedtime as needed., Disp: , Rfl:  .  lisinopril (PRINIVIL,ZESTRIL) 5 MG tablet, Take 5 mg by mouth daily., Disp: , Rfl:  .  metFORMIN (GLUCOPHAGE-XR) 500 MG 24 hr tablet, Take 2,000 mg by mouth daily., Disp: , Rfl:  .  metoprolol tartrate (LOPRESSOR) 25 MG tablet, Take 37.5 mg by mouth 2 (two) times daily., Disp: , Rfl:  .  oxyCODONE (OXY IR/ROXICODONE) 5 MG immediate release tablet, Take 5 mg by mouth every 4 (four) hours as needed for moderate pain or severe pain., Disp: , Rfl:  .  pramipexole (MIRAPEX) 0.75 MG tablet, Take 0.75 mg by mouth at bedtime., Disp: , Rfl:  .  testosterone cypionate (DEPOTESTOSTERONE CYPIONATE) 200 MG/ML injection, Inject 100 mg into the muscle every 14 (fourteen) days., Disp: , Rfl:   Past Medical History: No past medical history on file.  Tobacco Use: History  Smoking Status  . Not on file  Smokeless Tobacco  . Not on file    Labs: Recent Review Flowsheet Data    There is no flowsheet data to display.      Capillary Blood  Glucose: Lab Results  Component Value Date   GLUCAP 218 (H) 02/18/2017     Exercise Target Goals:    Exercise Program Goal: Individual exercise prescription set with THRR, safety & activity barriers. Participant demonstrates ability to understand and report RPE using BORG scale, to self-measure pulse accurately, and to acknowledge the importance of the exercise prescription.  Exercise Prescription Goal: Starting with aerobic activity 30 plus minutes a day, 3 days per week for initial exercise prescription. Provide home exercise prescription and guidelines that participant acknowledges understanding prior to discharge.  Activity Barriers & Risk Stratification:   6 Minute Walk:     6 Minute Walk    Row Name 02/09/17 1419         6 Minute Walk   Phase Initial     Distance 1550 feet     Distance % Change 0 %     Walk Time 6 minutes     # of Rest Breaks 0     MPH 2.93     METS 3.25     RPE 15     Perceived Dyspnea  14     VO2 Peak 13.22     Symptoms No     Resting HR 77 bpm     Resting BP 130/76     Max Ex. HR 100 bpm     Max Ex. BP  164/88     2 Minute Post BP 140/80        Oxygen Initial Assessment:     Oxygen Initial Assessment - 02/09/17 1507      Home Oxygen   Home Oxygen Device None   Sleep Oxygen Prescription None   Home Exercise Oxygen Prescription None   Home at Rest Exercise Oxygen Prescription None   Compliance with Home Oxygen Use No     Program Oxygen Prescription   Program Oxygen Prescription None      Oxygen Re-Evaluation:   Oxygen Discharge (Final Oxygen Re-Evaluation):   Initial Exercise Prescription:     Initial Exercise Prescription - 02/09/17 1400      Date of Initial Exercise RX and Referring Provider   Date 02/09/17   Referring Provider Dr. Lunette Stands     NuStep   Level 2   SPM 21   Minutes 20   METs 2     Recumbant Elliptical   Level 1   RPM 42   Watts 46   Minutes 15   METs 2.4     Prescription Details    Frequency (times per week) 3   Duration Progress to 30 minutes of continuous aerobic without signs/symptoms of physical distress     Intensity   THRR 40-80% of Max Heartrate [phone]   Ratings of Perceived Exertion 11-13   Perceived Dyspnea 0-4     Progression   Progression Continue progressive overload as per policy without signs/symptoms or physical distress.     Resistance Training   Training Prescription Yes   Weight 1   Reps 10-15      Perform Capillary Blood Glucose checks as needed.  Exercise Prescription Changes:      Exercise Prescription Changes    Row Name 03/06/17 1500 04/03/17 1400           Response to Exercise   Blood Pressure (Admit) 146/90 112/68      Blood Pressure (Exercise) 180/80 150/78      Blood Pressure (Exit) 154/84 104/66      Heart Rate (Admit) 66 bpm 71 bpm      Heart Rate (Exercise) 87 bpm 95 bpm      Heart Rate (Exit) 76 bpm 80 bpm      Rating of Perceived Exertion (Exercise) 12 12      Duration Progress to 30 minutes of  aerobic without signs/symptoms of physical distress Progress to 30 minutes of  aerobic without signs/symptoms of physical distress      Intensity THRR unchanged THRR unchanged        Progression   Progression Continue to progress workloads to maintain intensity without signs/symptoms of physical distress. Continue to progress workloads to maintain intensity without signs/symptoms of physical distress.        Resistance Training   Training Prescription Yes Yes      Weight 1 3      Reps 10-15 10-15        NuStep   Level 2 3      SPM 52 84      Minutes 20 20      METs 3.69 3.66        Recumbant Elliptical   Level 1 3      RPM 53 66      Watts 64 106      Minutes 15 15      METs 3.5 6.2        Home Exercise Plan   Plans  to continue exercise at Home (comment) Home (comment)      Frequency Add 2 additional days to program exercise sessions. Add 2 additional days to program exercise sessions.          Exercise Comments:      Exercise Comments    Row Name 02/17/17 1301 03/06/17 1520 04/03/17 1429       Exercise Comments Patient has not started CR yet from initial orientation.  Patient is progressing well in CR. He is progressing watts and maintaining levels.  Patient is progressing well in CR.         Exercise Goals and Review:      Exercise Goals    Row Name 02/09/17 1508             Exercise Goals   Increase Physical Activity Yes       Intervention Provide advice, education, support and counseling about physical activity/exercise needs.;Develop an individualized exercise prescription for aerobic and resistive training based on initial evaluation findings, risk stratification, comorbidities and participant's personal goals.       Expected Outcomes Achievement of increased cardiorespiratory fitness and enhanced flexibility, muscular endurance and strength shown through measurements of functional capacity and personal statement of participant.       Increase Strength and Stamina Yes       Intervention Provide advice, education, support and counseling about physical activity/exercise needs.;Develop an individualized exercise prescription for aerobic and resistive training based on initial evaluation findings, risk stratification, comorbidities and participant's personal goals.       Expected Outcomes Achievement of increased cardiorespiratory fitness and enhanced flexibility, muscular endurance and strength shown through measurements of functional capacity and personal statement of participant.          Exercise Goals Re-Evaluation :     Exercise Goals Re-Evaluation    Row Name 03/09/17 1342 04/09/17 0842           Exercise Goal Re-Evaluation   Exercise Goals Review Increase Physical Activity;Increase Strenth and Stamina  Do yardwork; live a long life. Increase Physical Activity;Increase Strenth and Stamina  Do yardwork; return to work.       Comments After 9  sessions, patient is progressing well in the program with increased strength. He is starting do do some yardwork again and plans to return to full time work 03/16/17.  After completing 20 sessions, patient contiunes to progress. He says he feels a lot stronger and is back to work full time without difficulty and is able to do yardwork.       Expected Outcomes Patient will hopefully be able to adjust his work schedule to continue to attend sessions completing the program.  Patient will complete the program with conitnued increased strength, stamina, and activity.          Discharge Exercise Prescription (Final Exercise Prescription Changes):     Exercise Prescription Changes - 04/03/17 1400      Response to Exercise   Blood Pressure (Admit) 112/68   Blood Pressure (Exercise) 150/78   Blood Pressure (Exit) 104/66   Heart Rate (Admit) 71 bpm   Heart Rate (Exercise) 95 bpm   Heart Rate (Exit) 80 bpm   Rating of Perceived Exertion (Exercise) 12   Duration Progress to 30 minutes of  aerobic without signs/symptoms of physical distress   Intensity THRR unchanged     Progression   Progression Continue to progress workloads to maintain intensity without signs/symptoms of physical distress.     Resistance Training  Training Prescription Yes   Weight 3   Reps 10-15     NuStep   Level 3   SPM 84   Minutes 20   METs 3.66     Recumbant Elliptical   Level 3   RPM 66   Watts 106   Minutes 15   METs 6.2     Home Exercise Plan   Plans to continue exercise at Home (comment)   Frequency Add 2 additional days to program exercise sessions.      Nutrition:  Target Goals: Understanding of nutrition guidelines, daily intake of sodium <1526m, cholesterol <2050m calories 30% from fat and 7% or less from saturated fats, daily to have 5 or more servings of fruits and vegetables.  Biometrics:     Pre Biometrics - 02/09/17 1421      Pre Biometrics   Height _0  (1.854 m)   Weight 227 lb  1.2 oz (103 kg)   Waist Circumference 42.5 inches   Hip Circumference 42 inches   Waist to Hip Ratio 1.01 %   BMI (Calculated) 30   Triceps Skinfold 6 mm   % Body Fat 25.3 %   Grip Strength 102 kg   Flexibility 12 in   Single Leg Stand 13 seconds       Nutrition Therapy Plan and Nutrition Goals:   Nutrition Discharge: Rate Your Plate Scores:   Nutrition Goals Re-Evaluation:   Nutrition Goals Discharge (Final Nutrition Goals Re-Evaluation):   Psychosocial: Target Goals: Acknowledge presence or absence of significant depression and/or stress, maximize coping skills, provide positive support system. Participant is able to verbalize types and ability to use techniques and skills needed for reducing stress and depression.  Initial Review & Psychosocial Screening:     Initial Psych Review & Screening - 02/09/17 1511      Initial Review   Current issues with None Identified     Family Dynamics   Good Support System? Yes     Barriers   Psychosocial barriers to participate in program There are no identifiable barriers or psychosocial needs.     Screening Interventions   Interventions Encouraged to exercise      Quality of Life Scores:     Quality of Life - 02/09/17 1422      Quality of Life Scores   Health/Function Pre 19.87 %   Socioeconomic Pre 15.5 %   Psych/Spiritual Pre 21.93 %   Family Pre 19.2 %   GLOBAL Pre 19.87 %      PHQ-9: Recent Review Flowsheet Data    [facility] 02/[facility]   Decreased Interest 0   Down, Depressed, Hopeless 0   PHQ - 2 Score 0   Altered sleeping 0   Tired, decreased energy 1   Change in appetite 0   Feeling bad or failure about yourself  0   Trouble concentrating 0   Moving slowly or fidgety/restless 0   Suicidal thoughts 0   PHQ-9 Score 1   Difficult doing work/chores Not difficult at all     Interpretation of Total Score  Total Score Depression Severity:  1-4 = Minimal depression, 5-9 = Mild  depression, 10-14 = Moderate depression, 15-19 = Moderately severe depression, 20-27 = Severe depression   Psychosocial Evaluation and Intervention:     Psychosocial Evaluation - 02/09/17 1511      Psychosocial Evaluation & Interventions   Interventions Encouraged to exercise with the program and follow exercise prescription   Continue Psychosocial Services  No Follow up required      Psychosocial Re-Evaluation:     Psychosocial Re-Evaluation    [place] Name 04/09/17 0845             Psychosocial Re-Evaluation   Current issues with None Identified       Expected Outcomes Patient will have no psychosocial issues identified at discharge.        Interventions Encouraged to attend Cardiac Rehabilitation for the exercise       Continue Psychosocial Services  No Follow up required          Psychosocial Discharge (Final Psychosocial Re-Evaluation):     Psychosocial Re-Evaluation - 04/09/17 0845      Psychosocial Re-Evaluation   Current issues with None Identified   Expected Outcomes Patient will have no psychosocial issues identified at discharge.    Interventions Encouraged to attend Cardiac Rehabilitation for the exercise   Continue Psychosocial Services  No Follow up required      Vocational Rehabilitation: Provide vocational rehab assistance to qualifying candidates.   Vocational Rehab Evaluation & Intervention:     Vocational Rehab - 02/09/17 1506      Initial Vocational Rehab Evaluation & Intervention   Assessment shows need for Vocational Rehabilitation No      Education: Education Goals: Education classes will be provided on a weekly basis, covering required topics. Participant will state understanding/return demonstration of topics presented.  Learning Barriers/Preferences:     Learning Barriers/Preferences - 02/09/17 1504      Learning Barriers/Preferences   Learning Barriers None   Learning Preferences Group Instruction;Individual Instruction;Skilled  Demonstration      Education Topics: Hypertension, Hypertension Reduction -Define heart disease and high blood pressure. Discus how high blood pressure affects the body and ways to reduce high blood pressure.   CARDIAC REHAB PHASE II EXERCISE from 04/08/2017 in [place]  Date  03/18/17  Educator  Russella Dar  Instruction Review Code  2- meets goals/outcomes      Exercise and Your Heart -Discuss why it is important to exercise, the FITT principles of exercise, normal and abnormal responses to exercise, and how to exercise safely.   CARDIAC REHAB PHASE II EXERCISE from 04/08/2017 in [place]  Date  03/25/17  Educator  DC  Instruction Review Code  2- meets goals/outcomes      Angina -Discuss definition of angina, causes of angina, treatment of angina, and how to decrease risk of having angina.   Cardiac Medications -Review what the following cardiac medications are used for, how they affect the body, and side effects that may occur when taking the medications.  Medications include Aspirin, Beta blockers, calcium channel blockers, ACE Inhibitors, angiotensin receptor blockers, diuretics, digoxin, and antihyperlipidemics.   CARDIAC REHAB PHASE II EXERCISE from 04/08/2017 in [place]  Date  04/08/17  Educator  DC  Instruction Review Code  2- meets goals/outcomes      Congestive Heart Failure -Discuss the definition of CHF, how to live with CHF, the signs and symptoms of CHF, and how keep track of weight and sodium intake.   Heart Disease and Intimacy -Discus the effect sexual activity has on the heart, how changes occur during intimacy as we age, and safety during sexual activity.   Smoking Cessation / COPD -Discuss different methods to quit smoking, the health benefits of quitting smoking, and the definition of COPD.   Nutrition I: Fats -Discuss the types of cholesterol, what cholesterol does to the  heart, and how cholesterol levels can be controlled.   Nutrition II: Labels -Discuss the different components of food labels and how to read food label   Heart Parts and Heart Disease -Discuss the anatomy of the heart, the pathway of blood circulation through the heart, and these are affected by heart disease.   CARDIAC REHAB PHASE II EXERCISE from 04/08/2017 in [place]  Date  02/18/17  Educator  DJ  Instruction Review Code  2- meets goals/outcomes      Stress I: Signs and Symptoms -Discuss the causes of stress, how stress may lead to anxiety and depression, and ways to limit stress.   CARDIAC REHAB PHASE II EXERCISE from 04/08/2017 in [place]  Date  02/25/17  Educator  D. Coad  Instruction Review Code  2- meets goals/outcomes      Stress II: Relaxation -Discuss different types of relaxation techniques to limit stress.   CARDIAC REHAB PHASE II EXERCISE from 04/08/2017 in [place]  Date  03/04/17  Educator  [place]  Instruction Review Code  2- meets goals/outcomes      Warning Signs of Stroke / TIA -Discuss definition of a stroke, what the signs and symptoms are of a stroke, and how to identify when someone is having stroke.   CARDIAC REHAB PHASE II EXERCISE from 04/08/2017 in [place]  Date  03/11/17  Educator  [place]  Instruction Review Code  2- meets goals/outcomes      Knowledge Questionnaire Score:     Knowledge Questionnaire Score - 02/09/17 1506      Knowledge Questionnaire Score   Pre Score 26/28      Core Components/Risk Factors/Patient Goals at Admission:     Personal Goals and Risk Factors at Admission - 02/09/17 1508      Core Components/Risk Factors/Patient Goals on Admission    Weight Management Weight Maintenance   Personal Goal Other Yes   Personal Goal Get back to yardwork, play golf, and live a long life.   Intervention Attend CR 3x week and  supplement with exercise at home 2 x week.   Expected Outcomes To reach personal goals      Core Components/Risk Factors/Patient Goals Review:      Goals and Risk Factor Review    Row Name 02/09/17 1511 03/09/17 1339 04/09/17 0828         Core Components/Risk Factors/Patient Goals Review   Personal Goals Review Weight Management/Obesity Hypertension;Diabetes;Weight Management/Obesity Weight Management/Obesity;Diabetes;Hypertension     Review  - Patient has completed 9 sessions gaining 6 lbs. He says he is trying to eat a healthier diet but needs to work on portions sizes. His b/p remains hypertensive. His cardiologist adjusted his medications last week.  Patient has completed 23 sessions losing 3.5 lbs. His b/p readings have improved and are now WNL. His reported fasting glucose readings are WNL. Patient says he feels better over.     Expected Outcomes  - Patient will complete the program meeting his personal goals.  Patient will complete the program meeting his personal goals.         Core Components/Risk Factors/Patient Goals at Discharge (Final Review):      Goals and Risk Factor Review - 04/09/17 0828      Core Components/Risk Factors/Patient Goals Review   Personal Goals Review Weight Management/Obesity;Diabetes;Hypertension   Review Patient has completed 23 sessions losing 3.5 lbs. His b/p readings have improved and are now WNL. His reported fasting glucose  readings are WNL. Patient says he feels better over.   Expected Outcomes Patient will complete the program meeting his personal goals.       ITP Comments:     ITP Comments    Row Name 02/18/17 1252 02/19/17 1539         ITP Comments Patient new to program. Plans to start today 02/18/17. Patient met with Registered Dietitian to discuss nutrition topics including: Heart healthty eating, heart health cooking and make smart choices when shopping; Portion control; weight management; and hydration. Patient attended a group  session with the hospital chaplian called [facility] to discuss and share how this recent diagnosis has effected their life         Comments: ITP 30 Day REVIEW Patient doing well in the program. Will continue to monitor for progress.

## 2017-04-10 ENCOUNTER — Encounter (HOSPITAL_COMMUNITY)
Admission: RE | Admit: 2017-04-10 | Discharge: 2017-04-10 | Disposition: A | Discharge status: Home / Self Care | Payer: BLUE CROSS/BLUE SHIELD | Source: Ambulatory Visit | Attending: Thoracic Surgery (Cardiothoracic Vascular Surgery) | Admitting: Thoracic Surgery (Cardiothoracic Vascular Surgery)

## 2017-04-10 DIAGNOSIS — I214 Non-ST elevation (NSTEMI) myocardial infarction: Secondary | ICD-10-CM | POA: Diagnosis not present

## 2017-04-13 ENCOUNTER — Encounter (HOSPITAL_COMMUNITY): Payer: BLUE CROSS/BLUE SHIELD

## 2017-04-15 ENCOUNTER — Encounter (HOSPITAL_COMMUNITY)
Admission: RE | Admit: 2017-04-15 | Discharge: 2017-04-15 | Disposition: A | Discharge status: Home / Self Care | Payer: BLUE CROSS/BLUE SHIELD | Source: Ambulatory Visit | Attending: Thoracic Surgery (Cardiothoracic Vascular Surgery) | Admitting: Thoracic Surgery (Cardiothoracic Vascular Surgery)

## 2017-04-15 DIAGNOSIS — I214 Non-ST elevation (NSTEMI) myocardial infarction: Secondary | ICD-10-CM | POA: Diagnosis not present

## 2017-04-15 DIAGNOSIS — Z951 Presence of aortocoronary bypass graft: Secondary | ICD-10-CM

## 2017-04-15 NOTE — Progress Notes (Signed)
Daily Session Note  Patient Details  Name: SILVIANO NEUSER MRN: [id number] Date of Birth: June 17, 1951 Referring Provider:     CARDIAC REHAB PHASE II ORIENTATION from 02/09/2017 in [place]  Referring Provider  Dr. Lunette Stands      Encounter Date: 04/15/2017  Check In:     Session Check In - 04/15/17 1118      Check-In   Location [facility]   Staff Present Aundra Dubin, RN, BSN;Maisen Klingler Luther Parody, BS, EP, Exercise Physiologist   Supervising physician immediately available to respond to emergencies See telemetry face sheet for immediately available MD   Medication changes reported     No   Fall or balance concerns reported    No   Warm-up and Cool-down Performed as group-led instruction   Resistance Training Performed Yes   VAD Patient? No     Pain Assessment   Currently in Pain? No/denies   Pain Score 0-No pain   Multiple Pain Sites No      Capillary Blood Glucose: No results found for this or any previous visit (from the past 24 hour(s)).    History  Smoking Status  . Not on file  Smokeless Tobacco  . Not on file    Goals Met:  Independence with exercise equipment Exercise tolerated well No report of cardiac concerns or symptoms Strength training completed today  Goals Unmet:  Not Applicable  Comments: Check out 1200   Dr. Kate Sable is Medical Director for [facility] and Pulmonary Re[facility].

## 2017-04-17 ENCOUNTER — Encounter (HOSPITAL_COMMUNITY)
Admission: RE | Admit: 2017-04-17 | Discharge: 2017-04-17 | Disposition: A | Discharge status: Home / Self Care | Payer: BLUE CROSS/BLUE SHIELD | Source: Ambulatory Visit | Attending: Thoracic Surgery (Cardiothoracic Vascular Surgery) | Admitting: Thoracic Surgery (Cardiothoracic Vascular Surgery)

## 2017-04-17 DIAGNOSIS — Z79899 Other long term (current) drug therapy: Secondary | ICD-10-CM | POA: Insufficient documentation

## 2017-04-17 DIAGNOSIS — Z951 Presence of aortocoronary bypass graft: Secondary | ICD-10-CM | POA: Diagnosis present

## 2017-04-17 DIAGNOSIS — I214 Non-ST elevation (NSTEMI) myocardial infarction: Secondary | ICD-10-CM | POA: Diagnosis present

## 2017-04-17 DIAGNOSIS — Z7984 Long term (current) use of oral hypoglycemic drugs: Secondary | ICD-10-CM | POA: Diagnosis not present

## 2017-04-17 DIAGNOSIS — Z7982 Long term (current) use of aspirin: Secondary | ICD-10-CM | POA: Insufficient documentation

## 2017-04-17 DIAGNOSIS — I10 Essential (primary) hypertension: Secondary | ICD-10-CM | POA: Insufficient documentation

## 2017-04-17 DIAGNOSIS — Z79891 Long term (current) use of opiate analgesic: Secondary | ICD-10-CM | POA: Insufficient documentation

## 2017-04-17 NOTE — Progress Notes (Signed)
Daily Session Note  Patient Details  Name: Jeffrey Matthews MRN: [id number] Date of Birth: May 21, 1951 Referring Provider:     CARDIAC REHAB PHASE II ORIENTATION from 02/09/2017 in [place]  Referring Provider  Dr. Lunette Stands      Encounter Date: 04/17/2017  Check In:     Session Check In - 04/17/17 1051      Check-In   Location [facility]   Staff Present Suzanne Boron, BS, EP, Exercise Physiologist;Debra Wynetta Emery, RN, BSN   Supervising physician immediately available to respond to emergencies See telemetry face sheet for immediately available MD   Medication changes reported     No   Fall or balance concerns reported    No   Warm-up and Cool-down Performed as group-led instruction   Resistance Training Performed Yes   VAD Patient? No     Pain Assessment   Currently in Pain? No/denies   Pain Score 0-No pain   Multiple Pain Sites No      Capillary Blood Glucose: No results found for this or any previous visit (from the past 24 hour(s)).    History  Smoking Status  . Not on file  Smokeless Tobacco  . Not on file    Goals Met:  Independence with exercise equipment Exercise tolerated well No report of cardiac concerns or symptoms Strength training completed today  Goals Unmet:  Not Applicable  Comments: Check out 1200   Dr. Kate Sable is Medical Director for [facility] and Pulmonary Rehab.

## 2017-04-20 ENCOUNTER — Encounter (HOSPITAL_COMMUNITY)
Admission: RE | Admit: 2017-04-20 | Discharge: 2017-04-20 | Disposition: A | Discharge status: Home / Self Care | Payer: BLUE CROSS/BLUE SHIELD | Source: Ambulatory Visit | Attending: Thoracic Surgery (Cardiothoracic Vascular Surgery) | Admitting: Thoracic Surgery (Cardiothoracic Vascular Surgery)

## 2017-04-20 DIAGNOSIS — Z951 Presence of aortocoronary bypass graft: Secondary | ICD-10-CM

## 2017-04-20 DIAGNOSIS — I214 Non-ST elevation (NSTEMI) myocardial infarction: Secondary | ICD-10-CM | POA: Diagnosis not present

## 2017-04-20 NOTE — Progress Notes (Signed)
Daily Session Note  Patient Details  Name: Jeffrey Matthews MRN: [id number] Date of Birth: 1951/08/27 Referring Provider:     CARDIAC REHAB PHASE II ORIENTATION from 02/09/2017 in [place]  Referring Provider  Dr. Lunette Stands      Encounter Date: 04/20/2017  Check In:     Session Check In - 04/20/17 1621      Check-In   Location [facility]   Staff Present Magdalynn Davilla Angelina Pih, MS, EP, [facility], Exercise Physiologist;Debra Wynetta Emery, RN, BSN   Supervising physician immediately available to respond to emergencies See telemetry face sheet for immediately available MD   Medication changes reported     No   Fall or balance concerns reported    No   Tobacco Cessation No Change   Warm-up and Cool-down Performed as group-led instruction   Resistance Training Performed No   VAD Patient? No     Pain Assessment   Currently in Pain? No/denies   Pain Score 0-No pain      Capillary Blood Glucose: No results found for this or any previous visit (from the past 24 hour(s)).      Exercise Prescription Changes - 04/20/17 1400      Response to Exercise   Blood Pressure (Admit) 124/74   Blood Pressure (Exercise) 178/80   Blood Pressure (Exit) 118/78   Heart Rate (Admit) 64 bpm   Heart Rate (Exercise) 83 bpm   Heart Rate (Exit) 84 bpm   Rating of Perceived Exertion (Exercise) 11   Duration Progress to 30 minutes of  aerobic without signs/symptoms of physical distress   Intensity THRR unchanged     Progression   Progression Continue to progress workloads to maintain intensity without signs/symptoms of physical distress.     Resistance Training   Training Prescription Yes   Weight 3   Reps 10-15     NuStep   Level 4   SPM 10   Minutes 20   METs 3.68     Recumbant Elliptical   Level 3   RPM 64   Watts 95   Minutes 15   METs 5.2     Home Exercise Plan   Plans to continue exercise at Home (comment)   Frequency Add 2 additional days to program exercise  sessions.      History  Smoking Status  . Not on file  Smokeless Tobacco  . Not on file    Goals Met:  Independence with exercise equipment Exercise tolerated well No report of cardiac concerns or symptoms Strength training completed today  Goals Unmet:  Not Applicable  Comments: Check out: 16:45   Dr. Kate Sable is Medical Director for [place] and Pulmonary Rehab.

## 2017-04-20 NOTE — Progress Notes (Signed)
Daily Session Note  Patient Details  Name: Fielding D Meissner MRN: [id number] Date of Birth: 02/04/1951 Referring Provider:     CARDIAC REHAB PHASE II ORIENTATION from 02/09/2017 in [place] [facility]  Referring Provider  Dr. Kincaid      Encounter Date: 04/20/2017  Check In:     Session Check In - 04/20/17 1100      Check-In   Location [facility]   Staff Present Gregory Cowan, BS, EP, Exercise Physiologist;Debra Johnson, RN, BSN   Supervising physician immediately available to respond to emergencies See telemetry face sheet for immediately available MD   Medication changes reported     No   Fall or balance concerns reported    No   Warm-up and Cool-down Performed as group-led instruction   Resistance Training Performed No   VAD Patient? No     Pain Assessment   Currently in Pain? No/denies   Pain Score 0-No pain   Multiple Pain Sites No      Capillary Blood Glucose: No results found for this or any previous visit (from the past 24 hour(s)).    History  Smoking Status  . Not on file  Smokeless Tobacco  . Not on file    Goals Met:  Independence with exercise equipment Exercise tolerated well No report of cardiac concerns or symptoms Strength training completed today  Goals Unmet:  Not Applicable  Comments: Check out 1200.   Dr. Suresh Koneswaran is Medical Director for [place] [facility]. 

## 2017-04-22 ENCOUNTER — Encounter (HOSPITAL_COMMUNITY)
Admission: RE | Admit: 2017-04-22 | Discharge: 2017-04-22 | Disposition: A | Discharge status: Home / Self Care | Payer: BLUE CROSS/BLUE SHIELD | Source: Ambulatory Visit | Attending: Thoracic Surgery (Cardiothoracic Vascular Surgery) | Admitting: Thoracic Surgery (Cardiothoracic Vascular Surgery)

## 2017-04-22 DIAGNOSIS — Z951 Presence of aortocoronary bypass graft: Secondary | ICD-10-CM

## 2017-04-22 DIAGNOSIS — I214 Non-ST elevation (NSTEMI) myocardial infarction: Secondary | ICD-10-CM | POA: Diagnosis not present

## 2017-04-22 NOTE — Progress Notes (Signed)
Daily Session Note  Patient Details  Name: Jeffrey Matthews MRN: [id number] Date of Birth: 1951-03-07 Referring Provider:     CARDIAC REHAB PHASE II ORIENTATION from 02/09/2017 in [place]  Referring Provider  Dr. Lunette Stands      Encounter Date: 04/22/2017  Check In:     Session Check In - 04/22/17 1110      Check-In   Location [facility]   Staff Present Aundra Dubin, RN, BSN;Clarance Bollard Luther Parody, BS, EP, Exercise Physiologist   Supervising physician immediately available to respond to emergencies See telemetry face sheet for immediately available MD   Medication changes reported     No   Fall or balance concerns reported    No   Warm-up and Cool-down Performed as group-led instruction   Resistance Training Performed Yes   VAD Patient? No     Pain Assessment   Currently in Pain? No/denies   Pain Score 0-No pain   Multiple Pain Sites No      Capillary Blood Glucose: No results found for this or any previous visit (from the past 24 hour(s)).    History  Smoking Status  . Not on file  Smokeless Tobacco  . Not on file    Goals Met:  Independence with exercise equipment Exercise tolerated well No report of cardiac concerns or symptoms Strength training completed today  Goals Unmet:  Not Applicable  Comments: Check out 1200   Dr. Kate Sable is Medical Director for [place] and Pulmonary Rehab.

## 2017-04-24 ENCOUNTER — Encounter (HOSPITAL_COMMUNITY)
Admission: RE | Admit: 2017-04-24 | Discharge: 2017-04-24 | Disposition: A | Discharge status: Home / Self Care | Payer: BLUE CROSS/BLUE SHIELD | Source: Ambulatory Visit | Attending: Thoracic Surgery (Cardiothoracic Vascular Surgery) | Admitting: Thoracic Surgery (Cardiothoracic Vascular Surgery)

## 2017-04-24 DIAGNOSIS — Z951 Presence of aortocoronary bypass graft: Secondary | ICD-10-CM

## 2017-04-24 DIAGNOSIS — I214 Non-ST elevation (NSTEMI) myocardial infarction: Secondary | ICD-10-CM | POA: Diagnosis not present

## 2017-04-24 NOTE — Progress Notes (Signed)
Daily Session Note  Patient Details  Name: DAVIT VASSAR MRN: [id number] Date of Birth: 04-Oct-1951 Referring Provider:     CARDIAC REHAB PHASE II ORIENTATION from 02/09/2017 in [place]  Referring Provider  Dr. Lunette Stands      Encounter Date: 04/24/2017  Check In:     Session Check In - 04/24/17 1108      Check-In   Location [facility]   Staff Present Aundra Dubin, RN, BSN;Ailana Cuadrado Luther Parody, BS, EP, Exercise Physiologist   Supervising physician immediately available to respond to emergencies See telemetry face sheet for immediately available MD   Medication changes reported     No   Fall or balance concerns reported    No   Warm-up and Cool-down Performed as group-led instruction   Resistance Training Performed Yes   VAD Patient? No     Pain Assessment   Currently in Pain? No/denies   Pain Score 0-No pain   Multiple Pain Sites No      Capillary Blood Glucose: No results found for this or any previous visit (from the past 24 hour(s)).    History  Smoking Status  . Not on file  Smokeless Tobacco  . Not on file    Goals Met:  Independence with exercise equipment Exercise tolerated well No report of cardiac concerns or symptoms Strength training completed today  Goals Unmet:  Not Applicable  Comments: Check out 1200   Dr. Kate Sable is Medical Director for [place] and Pulmonary Rehab.

## 2017-04-27 ENCOUNTER — Encounter (HOSPITAL_COMMUNITY)
Admission: RE | Admit: 2017-04-27 | Discharge: 2017-04-27 | Disposition: A | Discharge status: Home / Self Care | Payer: BLUE CROSS/BLUE SHIELD | Source: Ambulatory Visit | Attending: Thoracic Surgery (Cardiothoracic Vascular Surgery) | Admitting: Thoracic Surgery (Cardiothoracic Vascular Surgery)

## 2017-04-27 DIAGNOSIS — Z951 Presence of aortocoronary bypass graft: Secondary | ICD-10-CM

## 2017-04-27 DIAGNOSIS — I214 Non-ST elevation (NSTEMI) myocardial infarction: Secondary | ICD-10-CM | POA: Diagnosis not present

## 2017-04-27 NOTE — Progress Notes (Signed)
Daily Session Note  Patient Details  Name: Jeffrey Matthews MRN: [id number] Date of Birth: May 14, 1951 Referring Provider:     CARDIAC REHAB PHASE II ORIENTATION from 02/09/2017 in [place]  Referring Provider  Dr. Lunette Stands      Encounter Date: 04/27/2017  Check In:     Session Check In - 04/27/17 1101      Check-In   Location [facility]   Staff Present Diane Angelina Pih, MS, EP, [facility], Exercise Physiologist;Debra Wynetta Emery, RN, BSN;Petar Mucci, BS, EP, Exercise Physiologist   Supervising physician immediately available to respond to emergencies See telemetry face sheet for immediately available MD   Medication changes reported     No   Fall or balance concerns reported    No   Warm-up and Cool-down Performed as group-led instruction   Resistance Training Performed Yes   VAD Patient? No     Pain Assessment   Currently in Pain? No/denies   Pain Score 0-No pain   Multiple Pain Sites No      Capillary Blood Glucose: No results found for this or any previous visit (from the past 24 hour(s)).    History  Smoking Status  . Not on file  Smokeless Tobacco  . Not on file    Goals Met:  Independence with exercise equipment Exercise tolerated well No report of cardiac concerns or symptoms Strength training completed today  Goals Unmet:  None  Comments: Check out 1200.    Dr. Kate Sable is Medical Director for [facility] and [facility] Re[facility].

## 2017-04-29 ENCOUNTER — Encounter (HOSPITAL_COMMUNITY)
Admission: RE | Admit: 2017-04-29 | Discharge: 2017-04-29 | Disposition: A | Discharge status: Home / Self Care | Payer: BLUE CROSS/BLUE SHIELD | Source: Ambulatory Visit | Attending: Thoracic Surgery (Cardiothoracic Vascular Surgery) | Admitting: Thoracic Surgery (Cardiothoracic Vascular Surgery)

## 2017-04-29 DIAGNOSIS — I214 Non-ST elevation (NSTEMI) myocardial infarction: Secondary | ICD-10-CM

## 2017-04-29 DIAGNOSIS — Z951 Presence of aortocoronary bypass graft: Secondary | ICD-10-CM

## 2017-04-29 NOTE — Progress Notes (Signed)
Daily Session Note  Patient Details  Name: Jeffrey Matthews MRN: [id number] Date of Birth: [birth year] Referring Provider:     CARDIAC REHAB PHASE II ORIENTATION from 02/09/2017 in [place]  Referring Provider  Dr. Lunette Stands      Encounter Date: 04/29/2017  Check In:     Session Check In - 04/29/17 1057      Check-In   Location [facility]   Staff Present Diane Angelina Pih, MS, EP, [facility], Exercise Physiologist;Cyprian [place] Luther Parody, BS, EP, Exercise Physiologist;Debra Wynetta Emery, RN, BSN   Supervising physician immediately available to respond to emergencies See telemetry face sheet for immediately available MD   Medication changes reported     Yes   Comments Dr. Tivis Ringer 04/28/2017   Fall or balance concerns reported    No   Warm-up and Cool-down Performed as group-led instruction   Resistance Training Performed Yes   VAD Patient? No     Pain Assessment   Currently in Pain? No/denies   Pain Score 0-No pain   Multiple Pain Sites No      Capillary Blood Glucose: No results found for this or any previous visit (from the past 24 hour(s)).    History  Smoking Status  . Not on file  Smokeless Tobacco  . Not on file    Goals Met:  Independence with exercise equipment Exercise tolerated well No report of cardiac concerns or symptoms Strength training completed today  Goals Unmet:  Not Applicable  Comments: Check out 1200, new medication change Jardiamze by mouth 04/28/2017   Dr. Kate Sable is Medical Director for [place] and Pulmonary Rehab.

## 2017-04-30 NOTE — Progress Notes (Signed)
Cardiac Individual Treatment Plan  Patient Details  Name: Jeffrey Matthews MRN: [id number] Date of Birth: 14-Sep-1951 Referring Provider:     CARDIAC REHAB PHASE II ORIENTATION from 02/09/2017 in [place]  Referring Provider  Dr. Lunette Stands      Initial Encounter Date:    CARDIAC REHAB PHASE II ORIENTATION from 02/09/2017 in [place]  Date  02/09/17  Referring Provider  Dr. Lunette Stands      Visit Diagnosis: S/P CABG x 3  NSTEMI (non-ST elevated myocardial infarction) [facility])  Patient's Home Medications on Admission:  Current Outpatient Prescriptions:  .  aspirin EC 81 MG tablet, Take 81 mg by mouth every morning., Disp: , Rfl:  .  atorvastatin (LIPITOR) 80 MG tablet, Take 80 mg by mouth every morning., Disp: , Rfl:  .  clopidogrel (PLAVIX) 75 MG tablet, Take 75 mg by mouth every morning., Disp: , Rfl:  .  cyclobenzaprine (FLEXERIL) 10 MG tablet, Take 10 mg by mouth at bedtime as needed., Disp: , Rfl:  .  lisinopril (PRINIVIL,ZESTRIL) 5 MG tablet, Take 5 mg by mouth daily., Disp: , Rfl:  .  metFORMIN (GLUCOPHAGE-XR) 500 MG 24 hr tablet, Take 2,000 mg by mouth daily., Disp: , Rfl:  .  metoprolol tartrate (LOPRESSOR) 25 MG tablet, Take 37.5 mg by mouth 2 (two) times daily., Disp: , Rfl:  .  oxyCODONE (OXY IR/ROXICODONE) 5 MG immediate release tablet, Take 5 mg by mouth every 4 (four) hours as needed for moderate pain or severe pain., Disp: , Rfl:  .  pramipexole (MIRAPEX) 0.75 MG tablet, Take 0.75 mg by mouth at bedtime., Disp: , Rfl:  .  testosterone cypionate (DEPOTESTOSTERONE CYPIONATE) 200 MG/ML injection, Inject 100 mg into the muscle every 14 (fourteen) days., Disp: , Rfl:   Past Medical History: No past medical history on file.  Tobacco Use: History  Smoking Status  . Not on file  Smokeless Tobacco  . Not on file    Labs: Recent Review Flowsheet Data    There is no flowsheet data to display.      Capillary Blood  Glucose: Lab Results  Component Value Date   GLUCAP 218 (H) 02/18/2017     Exercise Target Goals:    Exercise Program Goal: Individual exercise prescription set with THRR, safety & activity barriers. Participant demonstrates ability to understand and report RPE using BORG scale, to self-measure pulse accurately, and to acknowledge the importance of the exercise prescription.  Exercise Prescription Goal: Starting with aerobic activity 30 plus minutes a day, 3 days per week for initial exercise prescription. Provide home exercise prescription and guidelines that participant acknowledges understanding prior to discharge.  Activity Barriers & Risk Stratification:   6 Minute Walk:     6 Minute Walk    Row Name 02/09/17 1419         6 Minute Walk   Phase Initial     Distance 1550 feet     Distance % Change 0 %     Walk Time 6 minutes     # of Rest Breaks 0     MPH 2.93     METS 3.25     RPE 15     Perceived Dyspnea  14     VO2 Peak 13.22     Symptoms No     Resting HR 77 bpm     Resting BP 130/76     Max Ex. HR 100 bpm     Max Ex. BP  164/88     2 Minute Post BP 140/80        Oxygen Initial Assessment:     Oxygen Initial Assessment - 02/09/17 1507      Home Oxygen   Home Oxygen Device None   Sleep Oxygen Prescription None   Home Exercise Oxygen Prescription None   Home at Rest Exercise Oxygen Prescription None   Compliance with Home Oxygen Use No     Program Oxygen Prescription   Program Oxygen Prescription None      Oxygen Re-Evaluation:   Oxygen Discharge (Final Oxygen Re-Evaluation):   Initial Exercise Prescription:     Initial Exercise Prescription - 02/09/17 1400      Date of Initial Exercise RX and Referring Provider   Date 02/09/17   Referring Provider Dr. Lunette Stands     NuStep   Level 2   SPM 21   Minutes 20   METs 2     Recumbant Elliptical   Level 1   RPM 42   Watts 46   Minutes 15   METs 2.4     Prescription Details    Frequency (times per week) 3   Duration Progress to 30 minutes of continuous aerobic without signs/symptoms of physical distress     Intensity   THRR 40-80% of Max Heartrate [id number]   Ratings of Perceived Exertion 11-13   Perceived Dyspnea 0-4     Progression   Progression Continue progressive overload as per policy without signs/symptoms or physical distress.     Resistance Training   Training Prescription Yes   Weight 1   Reps 10-15      Perform Capillary Blood Glucose checks as needed.  Exercise Prescription Changes:      Exercise Prescription Changes    Row Name 03/06/17 1500 04/03/17 1400 04/20/17 1400 04/29/17 1400       Response to Exercise   Blood Pressure (Admit) 146/90 112/68 124/74 100/70    Blood Pressure (Exercise) 180/80 150/78 178/80 170/80    Blood Pressure (Exit) 154/84 104/66 118/78 116/74    Heart Rate (Admit) 66 bpm 71 bpm 64 bpm 68 bpm    Heart Rate (Exercise) 87 bpm 95 bpm 83 bpm 95 bpm    Heart Rate (Exit) 76 bpm 80 bpm 84 bpm 79 bpm    Rating of Perceived Exertion (Exercise) _0 Duration Progress to 30 minutes of  aerobic without signs/symptoms of physical distress Progress to 30 minutes of  aerobic without signs/symptoms of physical distress Progress to 30 minutes of  aerobic without signs/symptoms of physical distress Progress to 30 minutes of  aerobic without signs/symptoms of physical distress    Intensity THRR unchanged THRR unchanged THRR unchanged THRR unchanged      Progression   Progression Continue to progress workloads to maintain intensity without signs/symptoms of physical distress. Continue to progress workloads to maintain intensity without signs/symptoms of physical distress. Continue to progress workloads to maintain intensity without signs/symptoms of physical distress. Continue to progress workloads to maintain intensity without signs/symptoms of physical distress.      Resistance Training   Training Prescription Yes  Yes Yes Yes    Weight _1 Reps 10-15 10-15 10-15 10-15      NuStep   Level _2 SPM 52 84 10 82    Minutes _3 METs 3.69 3.66 3.68 3  Recumbant Elliptical   Level _0 RPM 53 66 64 72    Watts 64 106 95 118    Minutes _1 METs 3.5 6.2 5.2 6.5      Home Exercise Plan   Plans to continue exercise at Home (comment) Home (comment) Home (comment) Home (comment)    Frequency Add 2 additional days to program exercise sessions. Add 2 additional days to program exercise sessions. Add 2 additional days to program exercise sessions. Add 2 additional days to program exercise sessions.       Exercise Comments:      Exercise Comments    Row Name 02/17/17 1301 03/06/17 1520 04/03/17 1429 04/20/17 1447 04/29/17 1442   Exercise Comments Patient has not started CR yet from initial orientation.  Patient is progressing well in CR. He is progressing watts and maintaining levels.  Patient is progressing well in CR.  Patient is doing well in CR.  Patient is progressing well in CR.       Exercise Goals and Review:      Exercise Goals    Row Name 02/09/17 1508             Exercise Goals   Increase Physical Activity Yes       Intervention Provide advice, education, support and counseling about physical activity/exercise needs.;Develop an individualized exercise prescription for aerobic and resistive training based on initial evaluation findings, risk stratification, comorbidities and participant's personal goals.       Expected Outcomes Achievement of increased cardiorespiratory fitness and enhanced flexibility, muscular endurance and strength shown through measurements of functional capacity and personal statement of participant.       Increase Strength and Stamina Yes       Intervention Provide advice, education, support and counseling about physical activity/exercise needs.;Develop an individualized exercise prescription for aerobic and resistive  training based on initial evaluation findings, risk stratification, comorbidities and participant's personal goals.       Expected Outcomes Achievement of increased cardiorespiratory fitness and enhanced flexibility, muscular endurance and strength shown through measurements of functional capacity and personal statement of participant.          Exercise Goals Re-Evaluation :     Exercise Goals Re-Evaluation    Row Name 03/09/17 1342 04/09/17 0842 04/30/17 1602         Exercise Goal Re-Evaluation   Exercise Goals Review Increase Physical Activity;Increase Strenth and Stamina  Do yardwork; live a long life. Increase Physical Activity;Increase Strenth and Stamina  Do yardwork; return to work.  Increase Physical Activity;Increase Strenth and Stamina  Do yard work.     Comments After 9 sessions, patient is progressing well in the program with increased strength. He is starting do do some yardwork again and plans to return to full time work 03/16/17.  After completing 20 sessions, patient contiunes to progress. He says he feels a lot stronger and is back to work full time without difficulty and is able to do yardwork.  After completing 31 sessions, patient is doing his yard work again without difficulty. He says he is stronger and has more energy and is able to do more. He continues to progress.     Expected Outcomes Patient will hopefully be able to adjust his work schedule to continue to attend sessions completing the program.  Patient will complete the program with conitnued increased strength, stamina, and activity. Patient will complete the program and continue exercising with  continued increased strength, stamina, and activity.         Discharge Exercise Prescription (Final Exercise Prescription Changes):     Exercise Prescription Changes - 04/29/17 1400      Response to Exercise   Blood Pressure (Admit) 100/70   Blood Pressure (Exercise) 170/80   Blood Pressure (Exit) 116/74   Heart  Rate (Admit) 68 bpm   Heart Rate (Exercise) 95 bpm   Heart Rate (Exit) 79 bpm   Rating of Perceived Exertion (Exercise) 11   Duration Progress to 30 minutes of  aerobic without signs/symptoms of physical distress   Intensity THRR unchanged     Progression   Progression Continue to progress workloads to maintain intensity without signs/symptoms of physical distress.     Resistance Training   Training Prescription Yes   Weight 4   Reps 10-15     NuStep   Level 4   SPM 82   Minutes 15   METs 3     Recumbant Elliptical   Level 4   RPM 72   Watts 118   Minutes 20   METs 6.5     Home Exercise Plan   Plans to continue exercise at Home (comment)   Frequency Add 2 additional days to program exercise sessions.      Nutrition:  Target Goals: Understanding of nutrition guidelines, daily intake of sodium <1560m, cholesterol <2016m calories 30% from fat and 7% or less from saturated fats, daily to have 5 or more servings of fruits and vegetables.  Biometrics:     Pre Biometrics - 02/09/17 1421      Pre Biometrics   Height _0  (1.854 m)   Weight 227 lb 1.2 oz (103 kg)   Waist Circumference 42.5 inches   Hip Circumference 42 inches   Waist to Hip Ratio 1.01 %   BMI (Calculated) 30   Triceps Skinfold 6 mm   % Body Fat 25.3 %   Grip Strength 102 kg   Flexibility 12 in   Single Leg Stand 13 seconds       Nutrition Therapy Plan and Nutrition Goals:   Nutrition Discharge: Rate Your Plate Scores:   Nutrition Goals Re-Evaluation:   Nutrition Goals Discharge (Final Nutrition Goals Re-Evaluation):   Psychosocial: Target Goals: Acknowledge presence or absence of significant depression and/or stress, maximize coping skills, provide positive support system. Participant is able to verbalize types and ability to use techniques and skills needed for reducing stress and depression.  Initial Review & Psychosocial Screening:     Initial Psych Review & Screening -  02/09/17 1511      Initial Review   Current issues with None Identified     Family Dynamics   Good Support System? Yes     Barriers   Psychosocial barriers to participate in program There are no identifiable barriers or psychosocial needs.     Screening Interventions   Interventions Encouraged to exercise      Quality of Life Scores:     Quality of Life - 02/09/17 1422      Quality of Life Scores   Health/Function Pre 19.87 %   Socioeconomic Pre 15.5 %   Psych/Spiritual Pre 21.93 %   Family Pre 19.2 %   GLOBAL Pre 19.87 %      PHQ-9: Recent Review Flowsheet Data    [facility] 02/[facility]   Decreased Interest 0   Down, Depressed, Hopeless 0   PHQ - 2 Score 0  Altered sleeping 0   Tired, decreased energy 1   Change in appetite 0   Feeling bad or failure about yourself  0   Trouble concentrating 0   Moving slowly or fidgety/restless 0   Suicidal thoughts 0   PHQ-9 Score 1   Difficult doing work/chores Not difficult at all     Interpretation of Total Score  Total Score Depression Severity:  1-4 = Minimal depression, 5-9 = Mild depression, 10-14 = Moderate depression, 15-19 = Moderately severe depression, 20-27 = Severe depression   Psychosocial Evaluation and Intervention:     Psychosocial Evaluation - 02/09/17 1511      Psychosocial Evaluation & Interventions   Interventions Encouraged to exercise with the program and follow exercise prescription   Continue Psychosocial Services  No Follow up required      Psychosocial Re-Evaluation:     Psychosocial Re-Evaluation    [place] Name 04/09/17 0845 04/30/17 1604           Psychosocial Re-Evaluation   Current issues with None Identified None Identified      Comments  - Patient continues to have no psychosocial issues identified.       Expected Outcomes Patient will have no psychosocial issues identified at discharge.  Patient will have no psychosocial issues identified at discharge.        Interventions Encouraged to attend Cardiac Rehabilitation for the exercise Encouraged to attend Cardiac Rehabilitation for the exercise      Continue Psychosocial Services  No Follow up required No Follow up required         Psychosocial Discharge (Final Psychosocial Re-Evaluation):     Psychosocial Re-Evaluation - 04/30/17 1604      Psychosocial Re-Evaluation   Current issues with None Identified   Comments Patient continues to have no psychosocial issues identified.    Expected Outcomes Patient will have no psychosocial issues identified at discharge.    Interventions Encouraged to attend Cardiac Rehabilitation for the exercise   Continue Psychosocial Services  No Follow up required      Vocational Rehabilitation: Provide vocational rehab assistance to qualifying candidates.   Vocational Rehab Evaluation & Intervention:     Vocational Rehab - 02/09/17 1506      Initial Vocational Rehab Evaluation & Intervention   Assessment shows need for Vocational Rehabilitation No      Education: Education Goals: Education classes will be provided on a weekly basis, covering required topics. Participant will state understanding/return demonstration of topics presented.  Learning Barriers/Preferences:     Learning Barriers/Preferences - 02/09/17 1504      Learning Barriers/Preferences   Learning Barriers None   Learning Preferences Group Instruction;Individual Instruction;Skilled Demonstration      Education Topics: Hypertension, Hypertension Reduction -Define heart disease and high blood pressure. Discus how high blood pressure affects the body and ways to reduce high blood pressure.   CARDIAC REHAB PHASE II EXERCISE from 04/29/2017 in [place]  Date  03/18/17  Educator  Russella Dar  Instruction Review Code  2- meets goals/outcomes      Exercise and Your Heart -Discuss why it is important to exercise, the FITT principles of exercise, normal and  abnormal responses to exercise, and how to exercise safely.   CARDIAC REHAB PHASE II EXERCISE from 04/29/2017 in [place]  Date  03/25/17  Educator  DC  Instruction Review Code  2- meets goals/outcomes      Angina -Discuss definition of angina, causes of angina, treatment of angina,  and how to decrease risk of having angina.   Cardiac Medications -Review what the following cardiac medications are used for, how they affect the body, and side effects that may occur when taking the medications.  Medications include Aspirin, Beta blockers, calcium channel blockers, ACE Inhibitors, angiotensin receptor blockers, diuretics, digoxin, and antihyperlipidemics.   CARDIAC REHAB PHASE II EXERCISE from 04/29/2017 in [place]  Date  04/08/17  Educator  DC  Instruction Review Code  2- meets goals/outcomes      Congestive Heart Failure -Discuss the definition of CHF, how to live with CHF, the signs and symptoms of CHF, and how keep track of weight and sodium intake.   CARDIAC REHAB PHASE II EXERCISE from 04/29/2017 in [place]  Date  04/15/17  Educator  DC  Instruction Review Code  2- meets goals/outcomes      Heart Disease and Intimacy -Discus the effect sexual activity has on the heart, how changes occur during intimacy as we age, and safety during sexual activity.   CARDIAC REHAB PHASE II EXERCISE from 04/29/2017 in [place]  Date  04/22/17  Educator  DJ  Instruction Review Code  2- meets goals/outcomes      Smoking Cessation / COPD -Discuss different methods to quit smoking, the health benefits of quitting smoking, and the definition of COPD.   CARDIAC REHAB PHASE II EXERCISE from 04/29/2017 in [place]  Date  04/29/17  Educator  Russella Dar  Instruction Review Code  2- meets goals/outcomes      Nutrition I: Fats -Discuss the types of cholesterol, what  cholesterol does to the heart, and how cholesterol levels can be controlled.   Nutrition II: Labels -Discuss the different components of food labels and how to read food label   Heart Parts and Heart Disease -Discuss the anatomy of the heart, the pathway of blood circulation through the heart, and these are affected by heart disease.   CARDIAC REHAB PHASE II EXERCISE from 04/29/2017 in [place]  Date  02/18/17  Educator  DJ  Instruction Review Code  2- meets goals/outcomes      Stress I: Signs and Symptoms -Discuss the causes of stress, how stress may lead to anxiety and depression, and ways to limit stress.   CARDIAC REHAB PHASE II EXERCISE from 04/29/2017 in [place]  Date  02/25/17  Educator  D. Coad  Instruction Review Code  2- meets goals/outcomes      Stress II: Relaxation -Discuss different types of relaxation techniques to limit stress.   CARDIAC REHAB PHASE II EXERCISE from 04/29/2017 in [place]  Date  03/04/17  Educator  [place]  Instruction Review Code  2- meets goals/outcomes      Warning Signs of Stroke / TIA -Discuss definition of a stroke, what the signs and symptoms are of a stroke, and how to identify when someone is having stroke.   CARDIAC REHAB PHASE II EXERCISE from 04/29/2017 in [place]  Date  03/11/17  Educator  [place]  Instruction Review Code  2- meets goals/outcomes      Knowledge Questionnaire Score:     Knowledge Questionnaire Score - 02/09/17 1506      Knowledge Questionnaire Score   Pre Score 26/28      Core Components/Risk Factors/Patient Goals at Admission:     Personal Goals and Risk Factors at Admission - 02/09/17 1508      Core  Components/Risk Factors/Patient Goals on Admission    Weight Management Weight Maintenance   Personal Goal Other Yes   Personal Goal Get back to yardwork, play golf, and live a long life.   Intervention Attend  CR 3x week and supplement with exercise at home 2 x week.   Expected Outcomes To reach personal goals      Core Components/Risk Factors/Patient Goals Review:      Goals and Risk Factor Review    Row Name 02/09/17 1511 03/09/17 1339 04/09/17 0828 04/30/17 1557       Core Components/Risk Factors/Patient Goals Review   Personal Goals Review Weight Management/Obesity Hypertension;Diabetes;Weight Management/Obesity Weight Management/Obesity;Diabetes;Hypertension Weight Management/Obesity;Diabetes;Hypertension    Review  - Patient has completed 9 sessions gaining 6 lbs. He says he is trying to eat a healthier diet but needs to work on portions sizes. His b/p remains hypertensive. His cardiologist adjusted his medications last week.  Patient has completed 23 sessions losing 3.5 lbs. His b/p readings have improved and are now WNL. His reported fasting glucose readings are WNL. Patient says he feels better over. Patient has completed 31 sessions losing 1.1 lbs. His b/p is not controlled with initial readings >120/80. No recent A1C but his reported fasting glucose readings have been WNL. He continues to do well in the program and says he can not believe how much better he feels since he stronger. He has more energy and is working full time without difficulty.     Expected Outcomes  - Patient will complete the program meeting his personal goals.  Patient will complete the program meeting his personal goals.  Patient will complete the program with continued controlled HTN, DM, and maintaining his weight.        Core Components/Risk Factors/Patient Goals at Discharge (Final Review):      Goals and Risk Factor Review - 04/30/17 1557      Core Components/Risk Factors/Patient Goals Review   Personal Goals Review Weight Management/Obesity;Diabetes;Hypertension   Review Patient has completed 31 sessions losing 1.1 lbs. His b/p is not controlled with initial readings >120/80. No recent A1C but his reported  fasting glucose readings have been WNL. He continues to do well in the program and says he can not believe how much better he feels since he stronger. He has more energy and is working full time without difficulty.    Expected Outcomes Patient will complete the program with continued controlled HTN, DM, and maintaining his weight.       ITP Comments:     ITP Comments    Row Name 02/18/17 1252 02/19/17 1539         ITP Comments Patient new to program. Plans to start today 02/18/17. Patient met with Registered Dietitian to discuss nutrition topics including: Heart healthty eating, heart health cooking and make smart choices when shopping; Portion control; weight management; and hydration. Patient attended a group session with the hospital chaplian called [facility] to discuss and share how this recent diagnosis has effected their life         Comments: ITP 30 Day REVIEW Patient doing well in the program. Will continue to monitor for progress.

## 2017-05-01 ENCOUNTER — Encounter (HOSPITAL_COMMUNITY)
Admission: RE | Admit: 2017-05-01 | Discharge: 2017-05-01 | Disposition: A | Discharge status: Home / Self Care | Payer: BLUE CROSS/BLUE SHIELD | Source: Ambulatory Visit | Attending: Thoracic Surgery (Cardiothoracic Vascular Surgery) | Admitting: Thoracic Surgery (Cardiothoracic Vascular Surgery)

## 2017-05-01 DIAGNOSIS — I214 Non-ST elevation (NSTEMI) myocardial infarction: Secondary | ICD-10-CM | POA: Diagnosis not present

## 2017-05-01 DIAGNOSIS — Z951 Presence of aortocoronary bypass graft: Secondary | ICD-10-CM

## 2017-05-01 NOTE — Progress Notes (Signed)
Daily Session Note  Patient Details  Name: Jeffrey Matthews MRN: [id number] Date of Birth: [birth year] Referring Provider:     CARDIAC REHAB PHASE II ORIENTATION from 02/09/2017 in [place]  Referring Provider  Dr. Lunette Stands      Encounter Date: 05/01/2017  Check In:     Session Check In - 05/01/17 1058      Check-In   Location [facility]   Staff Present Aundra Dubin, RN, BSN;Aimee Heldman Luther Parody, BS, EP, Exercise Physiologist   Supervising physician immediately available to respond to emergencies See telemetry face sheet for immediately available MD   Medication changes reported     No   Fall or balance concerns reported    No   Warm-up and Cool-down Performed as group-led instruction   Resistance Training Performed Yes   VAD Patient? No     Pain Assessment   Currently in Pain? No/denies   Pain Score 0-No pain   Multiple Pain Sites No      Capillary Blood Glucose: No results found for this or any previous visit (from the past 24 hour(s)).    History  Smoking Status  . Not on file  Smokeless Tobacco  . Not on file    Goals Met:  Independence with exercise equipment Exercise tolerated well No report of cardiac concerns or symptoms Strength training completed today  Goals Unmet:  Not Applicable  Comments: Check out 1200   Dr. Kate Sable is Medical Director for [place] and Pulmonary Rehab.

## 2017-05-04 ENCOUNTER — Encounter (HOSPITAL_COMMUNITY)
Admission: RE | Admit: 2017-05-04 | Discharge: 2017-05-04 | Disposition: A | Discharge status: Home / Self Care | Payer: BLUE CROSS/BLUE SHIELD | Source: Ambulatory Visit | Attending: Thoracic Surgery (Cardiothoracic Vascular Surgery) | Admitting: Thoracic Surgery (Cardiothoracic Vascular Surgery)

## 2017-05-04 DIAGNOSIS — I214 Non-ST elevation (NSTEMI) myocardial infarction: Secondary | ICD-10-CM | POA: Diagnosis not present

## 2017-05-04 DIAGNOSIS — Z951 Presence of aortocoronary bypass graft: Secondary | ICD-10-CM

## 2017-05-04 NOTE — Progress Notes (Signed)
Daily Session Note  Patient Details  Name: Jeffrey Matthews MRN: [id number] Date of Birth: 1950-11-24 Referring Provider:     CARDIAC REHAB PHASE II ORIENTATION from 02/09/2017 in [place]  Referring Provider  Dr. Lunette Stands      Encounter Date: 05/04/2017  Check In:     Session Check In - 05/04/17 1129      Check-In   Location [facility]   Staff Present Aundra Dubin, RN, BSN;Jud Fanguy Luther Parody, BS, EP, Exercise Physiologist   Supervising physician immediately available to respond to emergencies See telemetry face sheet for immediately available MD   Medication changes reported     No   Fall or balance concerns reported    No   Warm-up and Cool-down Performed as group-led instruction   Resistance Training Performed Yes   VAD Patient? No     Pain Assessment   Currently in Pain? No/denies   Pain Score 0-No pain   Multiple Pain Sites No      Capillary Blood Glucose: No results found for this or any previous visit (from the past 24 hour(s)).    History  Smoking Status  . Not on file  Smokeless Tobacco  . Not on file    Goals Met:  Independence with exercise equipment Exercise tolerated well No report of cardiac concerns or symptoms Strength training completed today  Goals Unmet:  Not Applicable  Comments: Check out 1200   Dr. Kate Sable is Medical Director for [facility] and [facility].

## 2017-05-06 ENCOUNTER — Encounter (HOSPITAL_COMMUNITY)
Admission: RE | Admit: 2017-05-06 | Discharge: 2017-05-06 | Disposition: A | Discharge status: Home / Self Care | Payer: BLUE CROSS/BLUE SHIELD | Source: Ambulatory Visit | Attending: Thoracic Surgery (Cardiothoracic Vascular Surgery) | Admitting: Thoracic Surgery (Cardiothoracic Vascular Surgery)

## 2017-05-06 DIAGNOSIS — I214 Non-ST elevation (NSTEMI) myocardial infarction: Secondary | ICD-10-CM | POA: Diagnosis not present

## 2017-05-08 ENCOUNTER — Encounter (HOSPITAL_COMMUNITY): Payer: BLUE CROSS/BLUE SHIELD

## 2017-05-11 ENCOUNTER — Encounter (HOSPITAL_COMMUNITY)
Admission: RE | Admit: 2017-05-11 | Discharge: 2017-05-11 | Disposition: A | Discharge status: Home / Self Care | Payer: BLUE CROSS/BLUE SHIELD | Source: Ambulatory Visit | Attending: Thoracic Surgery (Cardiothoracic Vascular Surgery) | Admitting: Thoracic Surgery (Cardiothoracic Vascular Surgery)

## 2017-05-11 DIAGNOSIS — I214 Non-ST elevation (NSTEMI) myocardial infarction: Secondary | ICD-10-CM | POA: Diagnosis not present

## 2017-05-11 DIAGNOSIS — Z951 Presence of aortocoronary bypass graft: Secondary | ICD-10-CM

## 2017-05-11 NOTE — Progress Notes (Signed)
Daily Session Note  Patient Details  Name: Jeffrey Matthews MRN: [id number] Date of Birth: 02-19-1951 Referring Provider:     CARDIAC REHAB PHASE II ORIENTATION from 02/09/2017 in [place]  Referring Provider  Dr. Lunette Stands      Encounter Date: 05/11/2017  Check In:     Session Check In - 05/11/17 1126      Check-In   Location [facility]   Staff Present Aundra Dubin, RN, BSN;Kinsleigh Ludolph Luther Parody, BS, EP, Exercise Physiologist   Supervising physician immediately available to respond to emergencies See telemetry face sheet for immediately available MD   Medication changes reported     No   Fall or balance concerns reported    No   Warm-up and Cool-down Performed as group-led instruction   Resistance Training Performed Yes   VAD Patient? No     Pain Assessment   Currently in Pain? No/denies   Pain Score 0-No pain   Multiple Pain Sites No      Capillary Blood Glucose: No results found for this or any previous visit (from the past 24 hour(s)).    History  Smoking Status  . Not on file  Smokeless Tobacco  . Not on file    Goals Met:  Independence with exercise equipment Exercise tolerated well No report of cardiac concerns or symptoms Strength training completed today  Goals Unmet:  Not Applicable  Comments: Check out 1200   Dr. Kate Sable is Medical Director for [facility] and [facility].

## 2017-05-13 ENCOUNTER — Encounter (HOSPITAL_COMMUNITY)
Admission: RE | Admit: 2017-05-13 | Discharge: 2017-05-13 | Disposition: A | Discharge status: Home / Self Care | Payer: BLUE CROSS/BLUE SHIELD | Source: Ambulatory Visit | Attending: Thoracic Surgery (Cardiothoracic Vascular Surgery) | Admitting: Thoracic Surgery (Cardiothoracic Vascular Surgery)

## 2017-05-13 VITALS — Ht 73.0 in | Wt 226.4 lb

## 2017-05-13 DIAGNOSIS — Z951 Presence of aortocoronary bypass graft: Secondary | ICD-10-CM

## 2017-05-13 DIAGNOSIS — I214 Non-ST elevation (NSTEMI) myocardial infarction: Secondary | ICD-10-CM | POA: Diagnosis not present

## 2017-05-13 NOTE — Progress Notes (Signed)
Daily Session Note  Patient Details  Name: Jeffrey Matthews MRN: [id number] Date of Birth: Sep 17, 1951 Referring Provider:     CARDIAC REHAB PHASE II ORIENTATION from 02/09/2017 in [place]  Referring Provider  Dr. Lunette Stands      Encounter Date: 05/13/2017  Check In:     Session Check In - 05/13/17 1117      Check-In   Location [facility]   Staff Present Aundra Dubin, RN, BSN;Myles Tavella Luther Parody, BS, EP, Exercise Physiologist   Supervising physician immediately available to respond to emergencies See telemetry face sheet for immediately available MD   Medication changes reported     No   Fall or balance concerns reported    No   Warm-up and Cool-down Performed as group-led instruction   Resistance Training Performed Yes   VAD Patient? No     Pain Assessment   Currently in Pain? No/denies   Pain Score 0-No pain   Multiple Pain Sites No      Capillary Blood Glucose: No results found for this or any previous visit (from the past 24 hour(s)).    History  Smoking Status  . Not on file  Smokeless Tobacco  . Not on file    Goals Met:  Independence with exercise equipment Exercise tolerated well No report of cardiac concerns or symptoms Strength training completed today  Goals Unmet:  Not Applicable  Comments: Check out 1200   Dr. Kate Sable is Medical Director for [facility] and [facility].

## 2017-05-14 NOTE — Progress Notes (Signed)
Cardiac Individual Treatment Plan  Patient Details  Name: Jeffrey Matthews MRN: [id number] Date of Birth: 1951/05/06 Referring Provider:     CARDIAC REHAB PHASE II ORIENTATION from 02/09/2017 in [place]  Referring Provider  Dr. Lunette Stands      Initial Encounter Date:    CARDIAC REHAB PHASE II ORIENTATION from 02/09/2017 in [place]  Date  02/09/17  Referring Provider  Dr. Lunette Stands      Visit Diagnosis: S/P CABG x 3  NSTEMI (non-ST elevated myocardial infarction) [facility])  Patient's Home Medications on Admission:  Current Outpatient Prescriptions:  .  aspirin EC 81 MG tablet, Take 81 mg by mouth every morning., Disp: , Rfl:  .  atorvastatin (LIPITOR) 80 MG tablet, Take 80 mg by mouth every morning., Disp: , Rfl:  .  clopidogrel (PLAVIX) 75 MG tablet, Take 75 mg by mouth every morning., Disp: , Rfl:  .  cyclobenzaprine (FLEXERIL) 10 MG tablet, Take 10 mg by mouth at bedtime as needed., Disp: , Rfl:  .  lisinopril (PRINIVIL,ZESTRIL) 5 MG tablet, Take 5 mg by mouth daily., Disp: , Rfl:  .  metFORMIN (GLUCOPHAGE-XR) 500 MG 24 hr tablet, Take 2,000 mg by mouth daily., Disp: , Rfl:  .  metoprolol tartrate (LOPRESSOR) 25 MG tablet, Take 37.5 mg by mouth 2 (two) times daily., Disp: , Rfl:  .  oxyCODONE (OXY IR/ROXICODONE) 5 MG immediate release tablet, Take 5 mg by mouth every 4 (four) hours as needed for moderate pain or severe pain., Disp: , Rfl:  .  pramipexole (MIRAPEX) 0.75 MG tablet, Take 0.75 mg by mouth at bedtime., Disp: , Rfl:  .  testosterone cypionate (DEPOTESTOSTERONE CYPIONATE) 200 MG/ML injection, Inject 100 mg into the muscle every 14 (fourteen) days., Disp: , Rfl:   Past Medical History: No past medical history on file.  Tobacco Use: History  Smoking Status  . Not on file  Smokeless Tobacco  . Not on file    Labs: Recent Review Flowsheet Data    There is no flowsheet data to display.      Capillary Blood  Glucose: Lab Results  Component Value Date   GLUCAP 218 (H) 02/18/2017     Exercise Target Goals:    Exercise Program Goal: Individual exercise prescription set with THRR, safety & activity barriers. Participant demonstrates ability to understand and report RPE using BORG scale, to self-measure pulse accurately, and to acknowledge the importance of the exercise prescription.  Exercise Prescription Goal: Starting with aerobic activity 30 plus minutes a day, 3 days per week for initial exercise prescription. Provide home exercise prescription and guidelines that participant acknowledges understanding prior to discharge.  Activity Barriers & Risk Stratification:   6 Minute Walk:     6 Minute Walk    Row Name 02/09/17 1419 05/14/17 0752       6 Minute Walk   Phase Initial Discharge    Distance 1550 feet 1850 feet    Distance % Change 0 % 19.35 %    Walk Time 6 minutes 6 minutes    # of Rest Breaks 0 0    MPH 2.93 3.5    METS 3.25 3.68    RPE 15 12    Perceived Dyspnea  14 12    VO2 Peak 13.22 13.66    Symptoms No No    Resting HR 77 bpm 77 bpm    Resting BP 130/76 110/70    Max Ex. HR 100 bpm 82 bpm  Max Ex. BP 164/88 134/74    2 Minute Post BP 140/80 122/64       Oxygen Initial Assessment:     Oxygen Initial Assessment - 02/09/17 1507      Home Oxygen   Home Oxygen Device None   Sleep Oxygen Prescription None   Home Exercise Oxygen Prescription None   Home at Rest Exercise Oxygen Prescription None   Compliance with Home Oxygen Use No     Program Oxygen Prescription   Program Oxygen Prescription None      Oxygen Re-Evaluation:   Oxygen Discharge (Final Oxygen Re-Evaluation):   Initial Exercise Prescription:     Initial Exercise Prescription - 02/09/17 1400      Date of Initial Exercise RX and Referring Provider   Date 02/09/17   Referring Provider Dr. Lunette Stands     NuStep   Level 2   SPM 21   Minutes 20   METs 2     Recumbant Elliptical    Level 1   RPM 42   Watts 46   Minutes 15   METs 2.4     Prescription Details   Frequency (times per week) 3   Duration Progress to 30 minutes of continuous aerobic without signs/symptoms of physical distress     Intensity   THRR 40-80% of Max Heartrate [phone]   Ratings of Perceived Exertion 11-13   Perceived Dyspnea 0-4     Progression   Progression Continue progressive overload as per policy without signs/symptoms or physical distress.     Resistance Training   Training Prescription Yes   Weight 1   Reps 10-15      Perform Capillary Blood Glucose checks as needed.  Exercise Prescription Changes:      Exercise Prescription Changes    Row Name 03/06/17 1500 04/03/17 1400 04/20/17 1400 04/29/17 1400       Response to Exercise   Blood Pressure (Admit) 146/90 112/68 124/74 100/70    Blood Pressure (Exercise) 180/80 150/78 178/80 170/80    Blood Pressure (Exit) 154/84 104/66 118/78 116/74    Heart Rate (Admit) 66 bpm 71 bpm 64 bpm 68 bpm    Heart Rate (Exercise) 87 bpm 95 bpm 83 bpm 95 bpm    Heart Rate (Exit) 76 bpm 80 bpm 84 bpm 79 bpm    Rating of Perceived Exertion (Exercise) 12 12 11 11     Duration Progress to 30 minutes of  aerobic without signs/symptoms of physical distress Progress to 30 minutes of  aerobic without signs/symptoms of physical distress Progress to 30 minutes of  aerobic without signs/symptoms of physical distress Progress to 30 minutes of  aerobic without signs/symptoms of physical distress    Intensity THRR unchanged THRR unchanged THRR unchanged THRR unchanged      Progression   Progression Continue to progress workloads to maintain intensity without signs/symptoms of physical distress. Continue to progress workloads to maintain intensity without signs/symptoms of physical distress. Continue to progress workloads to maintain intensity without signs/symptoms of physical distress. Continue to progress workloads to maintain intensity without  signs/symptoms of physical distress.      Resistance Training   Training Prescription Yes Yes Yes Yes    Weight 1 3 3 4     Reps 10-15 10-15 10-15 10-15      NuStep   Level 2 3 4 4     SPM 52 84 10 82    Minutes 20 20 20 15     METs 3.69 3.66 3.68 3  Recumbant Elliptical   Level 1 3 3 4     RPM 53 66 64 72    Watts 64 106 95 118    Minutes 15 15 15 20     METs 3.5 6.2 5.2 6.5      Home Exercise Plan   Plans to continue exercise at Home (comment) Home (comment) Home (comment) Home (comment)    Frequency Add 2 additional days to program exercise sessions. Add 2 additional days to program exercise sessions. Add 2 additional days to program exercise sessions. Add 2 additional days to program exercise sessions.       Exercise Comments:      Exercise Comments    Row Name 02/17/17 1301 03/06/17 1520 04/03/17 1429 04/20/17 1447 04/29/17 1442   Exercise Comments Patient has not started CR yet from initial orientation.  Patient is progressing well in CR. He is progressing watts and maintaining levels.  Patient is progressing well in CR.  Patient is doing well in CR.  Patient is progressing well in CR.       Exercise Goals and Review:      Exercise Goals    Row Name 02/09/17 1508             Exercise Goals   Increase Physical Activity Yes       Intervention Provide advice, education, support and counseling about physical activity/exercise needs.;Develop an individualized exercise prescription for aerobic and resistive training based on initial evaluation findings, risk stratification, comorbidities and participant's personal goals.       Expected Outcomes Achievement of increased cardiorespiratory fitness and enhanced flexibility, muscular endurance and strength shown through measurements of functional capacity and personal statement of participant.       Increase Strength and Stamina Yes       Intervention Provide advice, education, support and counseling about physical  activity/exercise needs.;Develop an individualized exercise prescription for aerobic and resistive training based on initial evaluation findings, risk stratification, comorbidities and participant's personal goals.       Expected Outcomes Achievement of increased cardiorespiratory fitness and enhanced flexibility, muscular endurance and strength shown through measurements of functional capacity and personal statement of participant.          Exercise Goals Re-Evaluation :     Exercise Goals Re-Evaluation    Row Name 03/09/17 1342 04/09/17 0842 04/30/17 1602 05/14/17 1255       Exercise Goal Re-Evaluation   Exercise Goals Review Increase Physical Activity;Increase Strenth and Stamina  Do yardwork; live a long life. Increase Physical Activity;Increase Strenth and Stamina  Do yardwork; return to work.  Increase Physical Activity;Increase Strenth and Stamina  Do yard work. Increase Physical Activity;Increase Strenth and Stamina    Comments After 9 sessions, patient is progressing well in the program with increased strength. He is starting do do some yardwork again and plans to return to full time work 03/16/17.  After completing 20 sessions, patient contiunes to progress. He says he feels a lot stronger and is back to work full time without difficulty and is able to do yardwork.  After completing 31 sessions, patient is doing his yard work again without difficulty. He says he is stronger and has more energy and is able to do more. He continues to progress. Patient graduated with 36 sessions with increased strength, stamina, and activity. He says he feels stronger and is able to do yard work now without difficulty. He has returned to full time employment. His exit walk test improved by 19.35%.  Expected Outcomes Patient will hopefully be able to adjust his work schedule to continue to attend sessions completing the program.  Patient will complete the program with conitnued increased strength, stamina,  and activity. Patient will complete the program and continue exercising with continued increased strength, stamina, and activity. Patient will continue exercising with continued increased strength, stamina, and activity.        Discharge Exercise Prescription (Final Exercise Prescription Changes):     Exercise Prescription Changes - 04/29/17 1400      Response to Exercise   Blood Pressure (Admit) 100/70   Blood Pressure (Exercise) 170/80   Blood Pressure (Exit) 116/74   Heart Rate (Admit) 68 bpm   Heart Rate (Exercise) 95 bpm   Heart Rate (Exit) 79 bpm   Rating of Perceived Exertion (Exercise) 11   Duration Progress to 30 minutes of  aerobic without signs/symptoms of physical distress   Intensity THRR unchanged     Progression   Progression Continue to progress workloads to maintain intensity without signs/symptoms of physical distress.     Resistance Training   Training Prescription Yes   Weight 4   Reps 10-15     NuStep   Level 4   SPM 82   Minutes 15   METs 3     Recumbant Elliptical   Level 4   RPM 72   Watts 118   Minutes 20   METs 6.5     Home Exercise Plan   Plans to continue exercise at Home (comment)   Frequency Add 2 additional days to program exercise sessions.      Nutrition:  Target Goals: Understanding of nutrition guidelines, daily intake of sodium <1514m, cholesterol <2021m calories 30% from fat and 7% or less from saturated fats, daily to have 5 or more servings of fruits and vegetables.  Biometrics:     Pre Biometrics - 02/09/17 1421      Pre Biometrics   Height 6' 1"  (1.854 m)   Weight 227 lb 1.2 oz (103 kg)   Waist Circumference 42.5 inches   Hip Circumference 42 inches   Waist to Hip Ratio 1.01 %   BMI (Calculated) 30   Triceps Skinfold 6 mm   % Body Fat 25.3 %   Grip Strength 102 kg   Flexibility 12 in   Single Leg Stand 13 seconds         Post Biometrics - 05/14/17 0752       Post  Biometrics   Height 6' 1"  (1.854 m)    Weight 226 lb 5.9 oz (102.7 kg)   Waist Circumference 43.5 inches   Hip Circumference 42 inches   Waist to Hip Ratio 1.04 %   BMI (Calculated) 29.9   Triceps Skinfold 5 mm   % Body Fat 25 %   Grip Strength 95.83 kg   Flexibility 13.67 in   Single Leg Stand 31 seconds      Nutrition Therapy Plan and Nutrition Goals:   Nutrition Discharge: Rate Your Plate Scores:     Nutrition Assessments - 05/14/17 1249      MEDFICTS Scores   Pre Score 22   Post Score 15   Score Difference -7      Nutrition Goals Re-Evaluation:   Nutrition Goals Discharge (Final Nutrition Goals Re-Evaluation):   Psychosocial: Target Goals: Acknowledge presence or absence of significant depression and/or stress, maximize coping skills, provide positive support system. Participant is able to verbalize types and ability to use techniques and skills  needed for reducing stress and depression.  Initial Review & Psychosocial Screening:     Initial Psych Review & Screening - 02/09/17 1511      Initial Review   Current issues with None Identified     Family Dynamics   Good Support System? Yes     Barriers   Psychosocial barriers to participate in program There are no identifiable barriers or psychosocial needs.     Screening Interventions   Interventions Encouraged to exercise      Quality of Life Scores:     Quality of Life - 05/14/17 0753      Quality of Life Scores   Health/Function Pre 19.87 %   Health/Function Post 23.97 %   Health/Function % Change 20.63 %   Socioeconomic Pre 18.5 %   Socioeconomic Post 22.43 %   Socioeconomic % Change  21.24 %   Psych/Spiritual Pre 21.93 %   Psych/Spiritual Post 24.36 %   Psych/Spiritual % Change 11.08 %   Family Pre 19.2 %   Family Post 21.6 %   Family % Change 12.5 %   GLOBAL Pre 19.87 %   GLOBAL Post 23.28 %   GLOBAL % Change 17.16 %      PHQ-9: Recent Review Flowsheet Data    Depression screen [facility] 2/9 05/14/2017 02/09/2017    Decreased Interest 0 0   Down, Depressed, Hopeless 1 0   PHQ - 2 Score 1 0   Altered sleeping 0 0   Tired, decreased energy 1 1   Change in appetite 0 0   Feeling bad or failure about yourself  0 0   Trouble concentrating 0 0   Moving slowly or fidgety/restless 0 0   Suicidal thoughts 0 0   PHQ-9 Score 2 1   Difficult doing work/chores Not difficult at all Not difficult at all     Interpretation of Total Score  Total Score Depression Severity:  1-4 = Minimal depression, 5-9 = Mild depression, 10-14 = Moderate depression, 15-19 = Moderately severe depression, 20-27 = Severe depression   Psychosocial Evaluation and Intervention:     Psychosocial Evaluation - 05/14/17 1251      Discharge Psychosocial Assessment & Intervention   Comments Patient has no psychosocial barriers identified at discharge. His exit QOL score improved by 17.16% at 23.28. His PHQ-9 score did not improve 1 to 2.      Psychosocial Re-Evaluation:     Psychosocial Re-Evaluation    [place] Name 04/09/17 0845 04/30/17 1604           Psychosocial Re-Evaluation   Current issues with None Identified None Identified      Comments  - Patient continues to have no psychosocial issues identified.       Expected Outcomes Patient will have no psychosocial issues identified at discharge.  Patient will have no psychosocial issues identified at discharge.       Interventions Encouraged to attend Cardiac Rehabilitation for the exercise Encouraged to attend Cardiac Rehabilitation for the exercise      Continue Psychosocial Services  No Follow up required No Follow up required         Psychosocial Discharge (Final Psychosocial Re-Evaluation):     Psychosocial Re-Evaluation - 04/30/17 1604      Psychosocial Re-Evaluation   Current issues with None Identified   Comments Patient continues to have no psychosocial issues identified.    Expected Outcomes Patient will have no psychosocial issues identified at discharge.     Interventions Encouraged  to attend Cardiac Rehabilitation for the exercise   Continue Psychosocial Services  No Follow up required      Vocational Rehabilitation: Provide vocational rehab assistance to qualifying candidates.   Vocational Rehab Evaluation & Intervention:     Vocational Rehab - 02/09/17 1506      Initial Vocational Rehab Evaluation & Intervention   Assessment shows need for Vocational Rehabilitation No      Education: Education Goals: Education classes will be provided on a weekly basis, covering required topics. Participant will state understanding/return demonstration of topics presented.  Learning Barriers/Preferences:     Learning Barriers/Preferences - 02/09/17 1504      Learning Barriers/Preferences   Learning Barriers None   Learning Preferences Group Instruction;Individual Instruction;Skilled Demonstration      Education Topics: Hypertension, Hypertension Reduction -Define heart disease and high blood pressure. Discus how high blood pressure affects the body and ways to reduce high blood pressure.   CARDIAC REHAB PHASE II EXERCISE from 05/13/2017 in [place]  Date  03/18/17  Educator  Russella Dar  Instruction Review Code  2- meets goals/outcomes      Exercise and Your Heart -Discuss why it is important to exercise, the FITT principles of exercise, normal and abnormal responses to exercise, and how to exercise safely.   CARDIAC REHAB PHASE II EXERCISE from 05/13/2017 in [place]  Date  03/25/17  Educator  DC  Instruction Review Code  2- meets goals/outcomes      Angina -Discuss definition of angina, causes of angina, treatment of angina, and how to decrease risk of having angina.   Cardiac Medications -Review what the following cardiac medications are used for, how they affect the body, and side effects that may occur when taking the medications.  Medications include Aspirin, Beta blockers,  calcium channel blockers, ACE Inhibitors, angiotensin receptor blockers, diuretics, digoxin, and antihyperlipidemics.   CARDIAC REHAB PHASE II EXERCISE from 05/13/2017 in [place]  Date  04/08/17  Educator  DC  Instruction Review Code  2- meets goals/outcomes      Congestive Heart Failure -Discuss the definition of CHF, how to live with CHF, the signs and symptoms of CHF, and how keep track of weight and sodium intake.   CARDIAC REHAB PHASE II EXERCISE from 05/13/2017 in [place]  Date  04/15/17  Educator  DC  Instruction Review Code  2- meets goals/outcomes      Heart Disease and Intimacy -Discus the effect sexual activity has on the heart, how changes occur during intimacy as we age, and safety during sexual activity.   CARDIAC REHAB PHASE II EXERCISE from 05/13/2017 in [place]  Date  04/22/17  Educator  DJ  Instruction Review Code  2- meets goals/outcomes      Smoking Cessation / COPD -Discuss different methods to quit smoking, the health benefits of quitting smoking, and the definition of COPD.   CARDIAC REHAB PHASE II EXERCISE from 05/13/2017 in [place]  Date  04/29/17  Educator  Russella Dar  Instruction Review Code  2- meets goals/outcomes      Nutrition I: Fats -Discuss the types of cholesterol, what cholesterol does to the heart, and how cholesterol levels can be controlled.   CARDIAC REHAB PHASE II EXERCISE from 05/13/2017 in [place]  Date  05/06/17  Educator  DC  Instruction Review Code  2- meets goals/outcomes      Nutrition II: Labels -Discuss the  different components of food labels and how to read food label   [place] from 05/13/2017 in [place]  Date  05/13/17  Educator  DC  Instruction Review Code  2- meets goals/outcomes      Heart Parts and Heart Disease -Discuss the anatomy of  the heart, the pathway of blood circulation through the heart, and these are affected by heart disease.   CARDIAC REHAB PHASE II EXERCISE from 05/13/2017 in [place]  Date  02/18/17  Educator  DJ  Instruction Review Code  2- meets goals/outcomes      Stress I: Signs and Symptoms -Discuss the causes of stress, how stress may lead to anxiety and depression, and ways to limit stress.   CARDIAC REHAB PHASE II EXERCISE from 05/13/2017 in [place]  Date  02/25/17  Educator  D. Coad  Instruction Review Code  2- meets goals/outcomes      Stress II: Relaxation -Discuss different types of relaxation techniques to limit stress.   CARDIAC REHAB PHASE II EXERCISE from 05/13/2017 in [place]  Date  03/04/17  Educator  [place]  Instruction Review Code  2- meets goals/outcomes      Warning Signs of Stroke / TIA -Discuss definition of a stroke, what the signs and symptoms are of a stroke, and how to identify when someone is having stroke.   CARDIAC REHAB PHASE II EXERCISE from 05/13/2017 in [place]  Date  03/11/17  Educator  [place]  Instruction Review Code  2- meets goals/outcomes      Knowledge Questionnaire Score:     Knowledge Questionnaire Score - 05/14/17 1249      Knowledge Questionnaire Score   Pre Score 26/28   Post Score 20/24      Core Components/Risk Factors/Patient Goals at Admission:     Personal Goals and Risk Factors at Admission - 02/09/17 1508      Core Components/Risk Factors/Patient Goals on Admission    Weight Management Weight Maintenance   Personal Goal Other Yes   Personal Goal Get back to yardwork, play golf, and live a long life.   Intervention Attend CR 3x week and supplement with exercise at home 2 x week.   Expected Outcomes To reach personal goals      Core Components/Risk Factors/Patient Goals Review:      Goals and Risk Factor Review    Row Name  02/09/17 1511 03/09/17 1339 04/09/17 0828 04/30/17 1557 05/14/17 1252     Core Components/Risk Factors/Patient Goals Review   Personal Goals Review Weight Management/Obesity Hypertension;Diabetes;Weight Management/Obesity Weight Management/Obesity;Diabetes;Hypertension Weight Management/Obesity;Diabetes;Hypertension Improve shortness of breath with ADL's  Do yard work; live a long life.    Review  - Patient has completed 9 sessions gaining 6 lbs. He says he is trying to eat a healthier diet but needs to work on portions sizes. His b/p remains hypertensive. His cardiologist adjusted his medications last week.  Patient has completed 23 sessions losing 3.5 lbs. His b/p readings have improved and are now WNL. His reported fasting glucose readings are WNL. Patient says he feels better over. Patient has completed 31 sessions losing 1.1 lbs. His b/p is not controlled with initial readings >120/80. No recent A1C but his reported fasting glucose readings have been WNL. He continues to do well in the program and says he can not believe how much better he feels since he stronger. He has more energy and is working  full time without difficulty.  Patient graduated with 36 sessions losing 8.1 lbs. He did well in the program. His exit flexibility and balance measurements improved. Patient says he feels much better since he started and is eating better. His Medficts scores improved 7%. He says the program met his personal goals. He plans to continue exercising at home by walking and playing golf.    Expected Outcomes  - Patient will complete the program meeting his personal goals.  Patient will complete the program meeting his personal goals.  Patient will complete the program with continued controlled HTN, DM, and maintaining his weight.  Patient will continue exercising and eating a heart healthy diet and continue to meet his personal goals.       Core Components/Risk Factors/Patient Goals at Discharge (Final Review):       Goals and Risk Factor Review - 05/14/17 1252      Core Components/Risk Factors/Patient Goals Review   Personal Goals Review Improve shortness of breath with ADL's  Do yard work; live a long life.    Review Patient graduated with 36 sessions losing 8.1 lbs. He did well in the program. His exit flexibility and balance measurements improved. Patient says he feels much better since he started and is eating better. His Medficts scores improved 7%. He says the program met his personal goals. He plans to continue exercising at home by walking and playing golf.    Expected Outcomes Patient will continue exercising and eating a heart healthy diet and continue to meet his personal goals.       ITP Comments:     ITP Comments    Row Name 02/18/17 1252 02/19/17 1539         ITP Comments Patient new to program. Plans to start today 02/18/17. Patient met with Registered Dietitian to discuss nutrition topics including: Heart healthty eating, heart health cooking and make smart choices when shopping; Portion control; weight management; and hydration. Patient attended a group session with the hospital chaplian called [facility] to discuss and share how this recent diagnosis has effected their life         Comments: Patient graduated from [place] today on 05/13/17 after completing 36 sessions. He achieved LTG of 30 minutes of aerobic exercise at Max Met level of 5.5. All patients vitals are WNL. Patient has met with dietician. Discharge instruction has been reviewed in detail and patient stated an understanding of material given. Patient plans to continue exercising at home walking and playing golf. Cardiac Rehab staff will make f/u calls at 1 month, 6 months, and 1 year. Patient had no complaints of any abnormal S/S or pain on their exit visit.

## 2017-05-14 NOTE — Progress Notes (Signed)
Discharge Summary  Patient Details  Name: Jeffrey Matthews MRN: [id number] Date of Birth: 1950/11/24 Referring Provider:     CARDIAC REHAB PHASE II ORIENTATION from 02/09/2017 in [place]  Referring Provider  Dr. Lunette Stands       Number of Visits: 36  Reason for Discharge:  Patient reached a stable level of exercise. Patient independent in their exercise.  Smoking History:  History  Smoking Status  . Not on file  Smokeless Tobacco  . Not on file    Diagnosis:  S/P CABG x 3  NSTEMI (non-ST elevated myocardial infarction) ([place])  ADL UCSD:   Initial Exercise Prescription:     Initial Exercise Prescription - 02/09/17 1400      Date of Initial Exercise RX and Referring Provider   Date 02/09/17   Referring Provider Dr. Lunette Stands     NuStep   Level 2   SPM 21   Minutes 20   METs 2     Recumbant Elliptical   Level 1   RPM 42   Watts 46   Minutes 15   METs 2.4     Prescription Details   Frequency (times per week) 3   Duration Progress to 30 minutes of continuous aerobic without signs/symptoms of physical distress     Intensity   THRR 40-80% of Max Heartrate [phone]   Ratings of Perceived Exertion 11-13   Perceived Dyspnea 0-4     Progression   Progression Continue progressive overload as per policy without signs/symptoms or physical distress.     Resistance Training   Training Prescription Yes   Weight 1   Reps 10-15      Discharge Exercise Prescription (Final Exercise Prescription Changes):     Exercise Prescription Changes - 04/29/17 1400      Response to Exercise   Blood Pressure (Admit) 100/70   Blood Pressure (Exercise) 170/80   Blood Pressure (Exit) 116/74   Heart Rate (Admit) 68 bpm   Heart Rate (Exercise) 95 bpm   Heart Rate (Exit) 79 bpm   Rating of Perceived Exertion (Exercise) 11   Duration Progress to 30 minutes of  aerobic without signs/symptoms of physical distress   Intensity THRR unchanged      Progression   Progression Continue to progress workloads to maintain intensity without signs/symptoms of physical distress.     Resistance Training   Training Prescription Yes   Weight 4   Reps 10-15     NuStep   Level 4   SPM 82   Minutes 15   METs 3     Recumbant Elliptical   Level 4   RPM 72   Watts 118   Minutes 20   METs 6.5     Home Exercise Plan   Plans to continue exercise at Home (comment)   Frequency Add 2 additional days to program exercise sessions.      Functional Capacity:     6 Minute Walk    Row Name 02/09/17 1419 05/14/17 0752       6 Minute Walk   Phase Initial Discharge    Distance 1550 feet 1850 feet    Distance % Change 0 % 19.35 %    Walk Time 6 minutes 6 minutes    # of Rest Breaks 0 0    MPH 2.93 3.5    METS 3.25 3.68    RPE 15 12    Perceived Dyspnea  14 12    VO2 Peak 13.22 13.66  Symptoms No No    Resting HR 77 bpm 77 bpm    Resting BP 130/76 110/70    Max Ex. HR 100 bpm 82 bpm    Max Ex. BP 164/88 134/74    2 Minute Post BP 140/80 122/64       Psychological, QOL, Others - Outcomes: PHQ 2/9: Depression screen [facility] 2/9 05/14/2017 02/09/2017  Decreased Interest 0 0  Down, Depressed, Hopeless 1 0  PHQ - 2 Score 1 0  Altered sleeping 0 0  Tired, decreased energy 1 1  Change in appetite 0 0  Feeling bad or failure about yourself  0 0  Trouble concentrating 0 0  Moving slowly or fidgety/restless 0 0  Suicidal thoughts 0 0  PHQ-9 Score 2 1  Difficult doing work/chores Not difficult at all Not difficult at all    Quality of Life:     Quality of Life - 05/14/17 0753      Quality of Life Scores   Health/Function Pre 19.87 %   Health/Function Post 23.97 %   Health/Function % Change 20.63 %   Socioeconomic Pre 18.5 %   Socioeconomic Post 22.43 %   Socioeconomic % Change  21.24 %   Psych/Spiritual Pre 21.93 %   Psych/Spiritual Post 24.36 %   Psych/Spiritual % Change 11.08 %   Family Pre 19.2 %   Family Post 21.6 %    Family % Change 12.5 %   GLOBAL Pre 19.87 %   GLOBAL Post 23.28 %   GLOBAL % Change 17.16 %      Personal Goals: Goals established at orientation with interventions provided to work toward goal.     Personal Goals and Risk Factors at Admission - 02/09/17 1508      Core Components/Risk Factors/Patient Goals on Admission    Weight Management Weight Maintenance   Personal Goal Other Yes   Personal Goal Get back to yardwork, play golf, and live a long life.   Intervention Attend CR 3x week and supplement with exercise at home 2 x week.   Expected Outcomes To reach personal goals       Personal Goals Discharge:     Goals and Risk Factor Review    Row Name 02/09/17 1511 03/09/17 1339 04/09/17 0828 04/30/17 1557 05/14/17 1252     Core Components/Risk Factors/Patient Goals Review   Personal Goals Review Weight Management/Obesity Hypertension;Diabetes;Weight Management/Obesity Weight Management/Obesity;Diabetes;Hypertension Weight Management/Obesity;Diabetes;Hypertension Improve shortness of breath with ADL's  Do yard work; live a long life.    Review  - Patient has completed 9 sessions gaining 6 lbs. He says he is trying to eat a healthier diet but needs to work on portions sizes. His b/p remains hypertensive. His cardiologist adjusted his medications last week.  Patient has completed 23 sessions losing 3.5 lbs. His b/p readings have improved and are now WNL. His reported fasting glucose readings are WNL. Patient says he feels better over. Patient has completed 31 sessions losing 1.1 lbs. His b/p is not controlled with initial readings >120/80. No recent A1C but his reported fasting glucose readings have been WNL. He continues to do well in the program and says he can not believe how much better he feels since he stronger. He has more energy and is working full time without difficulty.  Patient graduated with 36 sessions losing 8.1 lbs. He did well in the program. His exit flexibility and  balance measurements improved. Patient says he feels much better since he started and is eating  better. His Medficts scores improved 7%. He says the program met his personal goals. He plans to continue exercising at home by walking and playing golf.    Expected Outcomes  - Patient will complete the program meeting his personal goals.  Patient will complete the program meeting his personal goals.  Patient will complete the program with continued controlled HTN, DM, and maintaining his weight.  Patient will continue exercising and eating a heart healthy diet and continue to meet his personal goals.       Nutrition & Weight - Outcomes:     Pre Biometrics - 02/09/17 1421      Pre Biometrics   Height _0  (1.854 m)   Weight 227 lb 1.2 oz (103 kg)   Waist Circumference 42.5 inches   Hip Circumference 42 inches   Waist to Hip Ratio 1.01 %   BMI (Calculated) 30   Triceps Skinfold 6 mm   % Body Fat 25.3 %   Grip Strength 102 kg   Flexibility 12 in   Single Leg Stand 13 seconds         Post Biometrics - 05/14/17 0752       Post  Biometrics   Height _1  (1.854 m)   Weight 226 lb 5.9 oz (102.7 kg)   Waist Circumference 43.5 inches   Hip Circumference 42 inches   Waist to Hip Ratio 1.04 %   BMI (Calculated) 29.9   Triceps Skinfold 5 mm   % Body Fat 25 %   Grip Strength 95.83 kg   Flexibility 13.67 in   Single Leg Stand 31 seconds      Nutrition:   Nutrition Discharge:     Nutrition Assessments - 05/14/17 1249      MEDFICTS Scores   Pre Score 22   Post Score 15   Score Difference -7      Education Questionnaire Score:     Knowledge Questionnaire Score - 05/14/17 1249      Knowledge Questionnaire Score   Pre Score 26/28   Post Score 20/24      Goals reviewed with patient; copy given to patient.

## 2017-05-15 ENCOUNTER — Encounter (HOSPITAL_COMMUNITY): Payer: BLUE CROSS/BLUE SHIELD

## 2017-05-18 ENCOUNTER — Encounter (HOSPITAL_COMMUNITY): Payer: BLUE CROSS/BLUE SHIELD

## 2017-07-22 DIAGNOSIS — I10 Essential (primary) hypertension: Secondary | ICD-10-CM | POA: Diagnosis not present

## 2017-07-22 DIAGNOSIS — Z7984 Long term (current) use of oral hypoglycemic drugs: Secondary | ICD-10-CM | POA: Diagnosis not present

## 2017-07-22 DIAGNOSIS — Z87891 Personal history of nicotine dependence: Secondary | ICD-10-CM | POA: Diagnosis not present

## 2017-07-22 DIAGNOSIS — Z951 Presence of aortocoronary bypass graft: Secondary | ICD-10-CM | POA: Diagnosis not present

## 2017-07-22 DIAGNOSIS — E119 Type 2 diabetes mellitus without complications: Secondary | ICD-10-CM | POA: Diagnosis not present

## 2017-07-22 DIAGNOSIS — Z9889 Other specified postprocedural states: Secondary | ICD-10-CM | POA: Diagnosis not present

## 2017-07-22 DIAGNOSIS — Z7982 Long term (current) use of aspirin: Secondary | ICD-10-CM | POA: Diagnosis not present

## 2017-07-22 DIAGNOSIS — Z79899 Other long term (current) drug therapy: Secondary | ICD-10-CM | POA: Diagnosis not present

## 2017-07-22 DIAGNOSIS — I2510 Atherosclerotic heart disease of native coronary artery without angina pectoris: Secondary | ICD-10-CM | POA: Diagnosis not present

## 2017-07-22 DIAGNOSIS — R079 Chest pain, unspecified: Secondary | ICD-10-CM | POA: Diagnosis not present

## 2017-07-22 DIAGNOSIS — I25119 Atherosclerotic heart disease of native coronary artery with unspecified angina pectoris: Secondary | ICD-10-CM | POA: Diagnosis not present

## 2017-08-10 DIAGNOSIS — J441 Chronic obstructive pulmonary disease with (acute) exacerbation: Secondary | ICD-10-CM | POA: Diagnosis not present

## 2017-08-10 DIAGNOSIS — I2510 Atherosclerotic heart disease of native coronary artery without angina pectoris: Secondary | ICD-10-CM | POA: Diagnosis not present

## 2017-08-10 DIAGNOSIS — Z6828 Body mass index (BMI) 28.0-28.9, adult: Secondary | ICD-10-CM | POA: Diagnosis not present

## 2017-08-10 DIAGNOSIS — E1165 Type 2 diabetes mellitus with hyperglycemia: Secondary | ICD-10-CM | POA: Diagnosis not present

## 2017-12-09 DIAGNOSIS — E291 Testicular hypofunction: Secondary | ICD-10-CM | POA: Diagnosis not present

## 2017-12-09 DIAGNOSIS — D519 Vitamin B12 deficiency anemia, unspecified: Secondary | ICD-10-CM | POA: Diagnosis not present

## 2017-12-09 DIAGNOSIS — E1165 Type 2 diabetes mellitus with hyperglycemia: Secondary | ICD-10-CM | POA: Diagnosis not present

## 2017-12-09 DIAGNOSIS — I10 Essential (primary) hypertension: Secondary | ICD-10-CM | POA: Diagnosis not present

## 2017-12-09 DIAGNOSIS — I2510 Atherosclerotic heart disease of native coronary artery without angina pectoris: Secondary | ICD-10-CM | POA: Diagnosis not present

## 2017-12-09 DIAGNOSIS — E7800 Pure hypercholesterolemia, unspecified: Secondary | ICD-10-CM | POA: Diagnosis not present

## 2017-12-10 DIAGNOSIS — I10 Essential (primary) hypertension: Secondary | ICD-10-CM | POA: Diagnosis not present

## 2017-12-10 DIAGNOSIS — N401 Enlarged prostate with lower urinary tract symptoms: Secondary | ICD-10-CM | POA: Diagnosis not present

## 2017-12-10 DIAGNOSIS — J441 Chronic obstructive pulmonary disease with (acute) exacerbation: Secondary | ICD-10-CM | POA: Diagnosis not present

## 2017-12-10 DIAGNOSIS — R739 Hyperglycemia, unspecified: Secondary | ICD-10-CM | POA: Diagnosis not present

## 2017-12-10 DIAGNOSIS — J069 Acute upper respiratory infection, unspecified: Secondary | ICD-10-CM | POA: Diagnosis not present

## 2017-12-10 DIAGNOSIS — I2510 Atherosclerotic heart disease of native coronary artery without angina pectoris: Secondary | ICD-10-CM | POA: Diagnosis not present

## 2017-12-14 DIAGNOSIS — E291 Testicular hypofunction: Secondary | ICD-10-CM | POA: Diagnosis not present

## 2017-12-14 DIAGNOSIS — E1165 Type 2 diabetes mellitus with hyperglycemia: Secondary | ICD-10-CM | POA: Diagnosis not present

## 2017-12-14 DIAGNOSIS — I10 Essential (primary) hypertension: Secondary | ICD-10-CM | POA: Diagnosis not present

## 2017-12-14 DIAGNOSIS — I2510 Atherosclerotic heart disease of native coronary artery without angina pectoris: Secondary | ICD-10-CM | POA: Diagnosis not present

## 2018-05-25 DIAGNOSIS — J441 Chronic obstructive pulmonary disease with (acute) exacerbation: Secondary | ICD-10-CM | POA: Diagnosis not present

## 2018-05-25 DIAGNOSIS — Z6829 Body mass index (BMI) 29.0-29.9, adult: Secondary | ICD-10-CM | POA: Diagnosis not present

## 2018-06-07 DIAGNOSIS — E1165 Type 2 diabetes mellitus with hyperglycemia: Secondary | ICD-10-CM | POA: Diagnosis not present

## 2018-06-07 DIAGNOSIS — I10 Essential (primary) hypertension: Secondary | ICD-10-CM | POA: Diagnosis not present

## 2018-06-07 DIAGNOSIS — R739 Hyperglycemia, unspecified: Secondary | ICD-10-CM | POA: Diagnosis not present

## 2018-06-07 DIAGNOSIS — E7800 Pure hypercholesterolemia, unspecified: Secondary | ICD-10-CM | POA: Diagnosis not present

## 2018-06-09 DIAGNOSIS — I2510 Atherosclerotic heart disease of native coronary artery without angina pectoris: Secondary | ICD-10-CM | POA: Diagnosis not present

## 2018-06-09 DIAGNOSIS — Z1389 Encounter for screening for other disorder: Secondary | ICD-10-CM | POA: Diagnosis not present

## 2018-06-09 DIAGNOSIS — E1165 Type 2 diabetes mellitus with hyperglycemia: Secondary | ICD-10-CM | POA: Diagnosis not present

## 2018-06-09 DIAGNOSIS — Z1331 Encounter for screening for depression: Secondary | ICD-10-CM | POA: Diagnosis not present

## 2018-06-09 DIAGNOSIS — I10 Essential (primary) hypertension: Secondary | ICD-10-CM | POA: Diagnosis not present

## 2018-06-09 DIAGNOSIS — R350 Frequency of micturition: Secondary | ICD-10-CM | POA: Diagnosis not present

## 2018-06-21 DIAGNOSIS — D0461 Carcinoma in situ of skin of right upper limb, including shoulder: Secondary | ICD-10-CM | POA: Diagnosis not present

## 2018-06-21 DIAGNOSIS — D1809 Hemangioma of other sites: Secondary | ICD-10-CM | POA: Diagnosis not present

## 2018-06-21 DIAGNOSIS — D485 Neoplasm of uncertain behavior of skin: Secondary | ICD-10-CM | POA: Diagnosis not present

## 2018-06-21 DIAGNOSIS — L821 Other seborrheic keratosis: Secondary | ICD-10-CM | POA: Diagnosis not present

## 2018-08-04 DIAGNOSIS — Z79899 Other long term (current) drug therapy: Secondary | ICD-10-CM | POA: Diagnosis not present

## 2018-08-04 DIAGNOSIS — I25118 Atherosclerotic heart disease of native coronary artery with other forms of angina pectoris: Secondary | ICD-10-CM | POA: Diagnosis not present

## 2018-08-04 DIAGNOSIS — I2510 Atherosclerotic heart disease of native coronary artery without angina pectoris: Secondary | ICD-10-CM | POA: Diagnosis not present

## 2018-08-04 DIAGNOSIS — Z7982 Long term (current) use of aspirin: Secondary | ICD-10-CM | POA: Diagnosis not present

## 2018-08-04 DIAGNOSIS — R079 Chest pain, unspecified: Secondary | ICD-10-CM | POA: Diagnosis not present

## 2018-08-04 DIAGNOSIS — Z87891 Personal history of nicotine dependence: Secondary | ICD-10-CM | POA: Diagnosis not present

## 2018-08-04 DIAGNOSIS — Z951 Presence of aortocoronary bypass graft: Secondary | ICD-10-CM | POA: Diagnosis not present

## 2018-09-22 DIAGNOSIS — Z6829 Body mass index (BMI) 29.0-29.9, adult: Secondary | ICD-10-CM | POA: Diagnosis not present

## 2018-09-22 DIAGNOSIS — J209 Acute bronchitis, unspecified: Secondary | ICD-10-CM | POA: Diagnosis not present

## 2018-09-22 DIAGNOSIS — F17210 Nicotine dependence, cigarettes, uncomplicated: Secondary | ICD-10-CM | POA: Diagnosis not present

## 2018-09-22 DIAGNOSIS — E291 Testicular hypofunction: Secondary | ICD-10-CM | POA: Diagnosis not present

## 2018-10-11 DIAGNOSIS — Z6829 Body mass index (BMI) 29.0-29.9, adult: Secondary | ICD-10-CM | POA: Diagnosis not present

## 2018-10-11 DIAGNOSIS — J209 Acute bronchitis, unspecified: Secondary | ICD-10-CM | POA: Diagnosis not present

## 2018-10-11 DIAGNOSIS — E1165 Type 2 diabetes mellitus with hyperglycemia: Secondary | ICD-10-CM | POA: Diagnosis not present

## 2018-10-11 DIAGNOSIS — J441 Chronic obstructive pulmonary disease with (acute) exacerbation: Secondary | ICD-10-CM | POA: Diagnosis not present

## 2018-10-11 DIAGNOSIS — F17210 Nicotine dependence, cigarettes, uncomplicated: Secondary | ICD-10-CM | POA: Diagnosis not present

## 2018-10-11 DIAGNOSIS — E291 Testicular hypofunction: Secondary | ICD-10-CM | POA: Diagnosis not present

## 2018-10-11 DIAGNOSIS — R350 Frequency of micturition: Secondary | ICD-10-CM | POA: Diagnosis not present

## 2018-11-18 DIAGNOSIS — Z6829 Body mass index (BMI) 29.0-29.9, adult: Secondary | ICD-10-CM | POA: Diagnosis not present

## 2018-11-18 DIAGNOSIS — R350 Frequency of micturition: Secondary | ICD-10-CM | POA: Diagnosis not present

## 2018-11-18 DIAGNOSIS — E1165 Type 2 diabetes mellitus with hyperglycemia: Secondary | ICD-10-CM | POA: Diagnosis not present

## 2018-11-18 DIAGNOSIS — J441 Chronic obstructive pulmonary disease with (acute) exacerbation: Secondary | ICD-10-CM | POA: Diagnosis not present

## 2018-11-25 DIAGNOSIS — I10 Essential (primary) hypertension: Secondary | ICD-10-CM | POA: Diagnosis not present

## 2018-11-25 DIAGNOSIS — F17210 Nicotine dependence, cigarettes, uncomplicated: Secondary | ICD-10-CM | POA: Diagnosis not present

## 2018-11-25 DIAGNOSIS — R634 Abnormal weight loss: Secondary | ICD-10-CM | POA: Diagnosis not present

## 2018-11-25 DIAGNOSIS — E1165 Type 2 diabetes mellitus with hyperglycemia: Secondary | ICD-10-CM | POA: Diagnosis not present

## 2018-11-29 DIAGNOSIS — E291 Testicular hypofunction: Secondary | ICD-10-CM | POA: Diagnosis not present

## 2018-11-29 DIAGNOSIS — F17210 Nicotine dependence, cigarettes, uncomplicated: Secondary | ICD-10-CM | POA: Diagnosis not present

## 2018-11-29 DIAGNOSIS — E1165 Type 2 diabetes mellitus with hyperglycemia: Secondary | ICD-10-CM | POA: Diagnosis not present

## 2018-11-29 DIAGNOSIS — I10 Essential (primary) hypertension: Secondary | ICD-10-CM | POA: Diagnosis not present

## 2019-02-01 DIAGNOSIS — Z6829 Body mass index (BMI) 29.0-29.9, adult: Secondary | ICD-10-CM | POA: Diagnosis not present

## 2019-02-01 DIAGNOSIS — J209 Acute bronchitis, unspecified: Secondary | ICD-10-CM | POA: Diagnosis not present

## 2019-03-11 DIAGNOSIS — F17210 Nicotine dependence, cigarettes, uncomplicated: Secondary | ICD-10-CM | POA: Diagnosis not present

## 2019-03-11 DIAGNOSIS — R05 Cough: Secondary | ICD-10-CM | POA: Diagnosis not present

## 2019-03-11 DIAGNOSIS — I10 Essential (primary) hypertension: Secondary | ICD-10-CM | POA: Diagnosis not present

## 2019-03-11 DIAGNOSIS — E291 Testicular hypofunction: Secondary | ICD-10-CM | POA: Diagnosis not present

## 2019-03-11 DIAGNOSIS — E1165 Type 2 diabetes mellitus with hyperglycemia: Secondary | ICD-10-CM | POA: Diagnosis not present

## 2019-03-28 DIAGNOSIS — J209 Acute bronchitis, unspecified: Secondary | ICD-10-CM | POA: Diagnosis not present

## 2019-03-28 DIAGNOSIS — F17210 Nicotine dependence, cigarettes, uncomplicated: Secondary | ICD-10-CM | POA: Diagnosis not present

## 2019-03-28 DIAGNOSIS — Z6829 Body mass index (BMI) 29.0-29.9, adult: Secondary | ICD-10-CM | POA: Diagnosis not present

## 2019-06-22 DIAGNOSIS — I10 Essential (primary) hypertension: Secondary | ICD-10-CM | POA: Diagnosis not present

## 2019-06-22 DIAGNOSIS — R739 Hyperglycemia, unspecified: Secondary | ICD-10-CM | POA: Diagnosis not present

## 2019-06-22 DIAGNOSIS — E291 Testicular hypofunction: Secondary | ICD-10-CM | POA: Diagnosis not present

## 2019-06-22 DIAGNOSIS — E1165 Type 2 diabetes mellitus with hyperglycemia: Secondary | ICD-10-CM | POA: Diagnosis not present

## 2019-06-27 DIAGNOSIS — F17210 Nicotine dependence, cigarettes, uncomplicated: Secondary | ICD-10-CM | POA: Diagnosis not present

## 2019-06-27 DIAGNOSIS — E291 Testicular hypofunction: Secondary | ICD-10-CM | POA: Diagnosis not present

## 2019-06-27 DIAGNOSIS — I10 Essential (primary) hypertension: Secondary | ICD-10-CM | POA: Diagnosis not present

## 2019-06-27 DIAGNOSIS — E1165 Type 2 diabetes mellitus with hyperglycemia: Secondary | ICD-10-CM | POA: Diagnosis not present

## 2019-06-27 DIAGNOSIS — Z6828 Body mass index (BMI) 28.0-28.9, adult: Secondary | ICD-10-CM | POA: Diagnosis not present

## 2019-07-01 DIAGNOSIS — M47814 Spondylosis without myelopathy or radiculopathy, thoracic region: Secondary | ICD-10-CM | POA: Diagnosis not present

## 2019-07-01 DIAGNOSIS — K5730 Diverticulosis of large intestine without perforation or abscess without bleeding: Secondary | ICD-10-CM | POA: Diagnosis not present

## 2019-07-01 DIAGNOSIS — I7090 Unspecified atherosclerosis: Secondary | ICD-10-CM | POA: Diagnosis not present

## 2019-07-01 DIAGNOSIS — S2232XD Fracture of one rib, left side, subsequent encounter for fracture with routine healing: Secondary | ICD-10-CM | POA: Diagnosis not present

## 2019-07-01 DIAGNOSIS — R05 Cough: Secondary | ICD-10-CM | POA: Diagnosis not present

## 2019-08-17 DIAGNOSIS — I2510 Atherosclerotic heart disease of native coronary artery without angina pectoris: Secondary | ICD-10-CM | POA: Diagnosis not present

## 2019-08-17 DIAGNOSIS — Z79899 Other long term (current) drug therapy: Secondary | ICD-10-CM | POA: Diagnosis not present

## 2019-08-17 DIAGNOSIS — Z951 Presence of aortocoronary bypass graft: Secondary | ICD-10-CM | POA: Diagnosis not present

## 2019-08-17 DIAGNOSIS — R0602 Shortness of breath: Secondary | ICD-10-CM | POA: Diagnosis not present

## 2019-08-17 DIAGNOSIS — I10 Essential (primary) hypertension: Secondary | ICD-10-CM | POA: Diagnosis not present

## 2019-08-17 DIAGNOSIS — F17210 Nicotine dependence, cigarettes, uncomplicated: Secondary | ICD-10-CM | POA: Diagnosis not present

## 2019-10-10 DIAGNOSIS — G5601 Carpal tunnel syndrome, right upper limb: Secondary | ICD-10-CM | POA: Diagnosis not present

## 2019-10-10 DIAGNOSIS — G5603 Carpal tunnel syndrome, bilateral upper limbs: Secondary | ICD-10-CM | POA: Diagnosis not present

## 2019-10-10 DIAGNOSIS — Z6829 Body mass index (BMI) 29.0-29.9, adult: Secondary | ICD-10-CM | POA: Diagnosis not present

## 2019-11-07 DIAGNOSIS — G5603 Carpal tunnel syndrome, bilateral upper limbs: Secondary | ICD-10-CM | POA: Diagnosis not present

## 2019-11-07 DIAGNOSIS — Z6829 Body mass index (BMI) 29.0-29.9, adult: Secondary | ICD-10-CM | POA: Diagnosis not present

## 2019-12-23 DIAGNOSIS — R739 Hyperglycemia, unspecified: Secondary | ICD-10-CM | POA: Diagnosis not present

## 2019-12-23 DIAGNOSIS — I10 Essential (primary) hypertension: Secondary | ICD-10-CM | POA: Diagnosis not present

## 2019-12-23 DIAGNOSIS — E291 Testicular hypofunction: Secondary | ICD-10-CM | POA: Diagnosis not present

## 2019-12-23 DIAGNOSIS — Z13220 Encounter for screening for lipoid disorders: Secondary | ICD-10-CM | POA: Diagnosis not present

## 2019-12-23 DIAGNOSIS — E1165 Type 2 diabetes mellitus with hyperglycemia: Secondary | ICD-10-CM | POA: Diagnosis not present

## 2019-12-26 DIAGNOSIS — I10 Essential (primary) hypertension: Secondary | ICD-10-CM | POA: Diagnosis not present

## 2019-12-26 DIAGNOSIS — E1165 Type 2 diabetes mellitus with hyperglycemia: Secondary | ICD-10-CM | POA: Diagnosis not present

## 2019-12-26 DIAGNOSIS — Z6829 Body mass index (BMI) 29.0-29.9, adult: Secondary | ICD-10-CM | POA: Diagnosis not present

## 2019-12-26 DIAGNOSIS — E291 Testicular hypofunction: Secondary | ICD-10-CM | POA: Diagnosis not present

## 2019-12-26 DIAGNOSIS — F17210 Nicotine dependence, cigarettes, uncomplicated: Secondary | ICD-10-CM | POA: Diagnosis not present

## 2020-02-20 DIAGNOSIS — R062 Wheezing: Secondary | ICD-10-CM | POA: Diagnosis not present

## 2020-02-20 DIAGNOSIS — J209 Acute bronchitis, unspecified: Secondary | ICD-10-CM | POA: Diagnosis not present

## 2020-03-19 DIAGNOSIS — I10 Essential (primary) hypertension: Secondary | ICD-10-CM | POA: Diagnosis not present

## 2020-03-19 DIAGNOSIS — J441 Chronic obstructive pulmonary disease with (acute) exacerbation: Secondary | ICD-10-CM | POA: Diagnosis not present

## 2020-03-19 DIAGNOSIS — E1165 Type 2 diabetes mellitus with hyperglycemia: Secondary | ICD-10-CM | POA: Diagnosis not present

## 2020-03-19 DIAGNOSIS — Z720 Tobacco use: Secondary | ICD-10-CM | POA: Diagnosis not present

## 2020-03-26 DIAGNOSIS — F17210 Nicotine dependence, cigarettes, uncomplicated: Secondary | ICD-10-CM | POA: Diagnosis not present

## 2020-03-26 DIAGNOSIS — I10 Essential (primary) hypertension: Secondary | ICD-10-CM | POA: Diagnosis not present

## 2020-03-26 DIAGNOSIS — E291 Testicular hypofunction: Secondary | ICD-10-CM | POA: Diagnosis not present

## 2020-03-26 DIAGNOSIS — Z6829 Body mass index (BMI) 29.0-29.9, adult: Secondary | ICD-10-CM | POA: Diagnosis not present

## 2020-03-26 DIAGNOSIS — E1165 Type 2 diabetes mellitus with hyperglycemia: Secondary | ICD-10-CM | POA: Diagnosis not present

## 2020-05-01 DIAGNOSIS — R109 Unspecified abdominal pain: Secondary | ICD-10-CM | POA: Diagnosis not present

## 2020-05-01 DIAGNOSIS — R197 Diarrhea, unspecified: Secondary | ICD-10-CM | POA: Diagnosis not present

## 2020-05-01 DIAGNOSIS — R1084 Generalized abdominal pain: Secondary | ICD-10-CM | POA: Diagnosis not present

## 2020-05-07 ENCOUNTER — Encounter: Payer: Self-pay | Admitting: Gastroenterology

## 2020-05-07 DIAGNOSIS — F17210 Nicotine dependence, cigarettes, uncomplicated: Secondary | ICD-10-CM | POA: Diagnosis not present

## 2020-05-07 DIAGNOSIS — R739 Hyperglycemia, unspecified: Secondary | ICD-10-CM | POA: Diagnosis not present

## 2020-05-07 DIAGNOSIS — I10 Essential (primary) hypertension: Secondary | ICD-10-CM | POA: Diagnosis not present

## 2020-05-07 DIAGNOSIS — E1165 Type 2 diabetes mellitus with hyperglycemia: Secondary | ICD-10-CM | POA: Diagnosis not present

## 2020-05-09 DIAGNOSIS — I10 Essential (primary) hypertension: Secondary | ICD-10-CM | POA: Diagnosis not present

## 2020-05-09 DIAGNOSIS — F17210 Nicotine dependence, cigarettes, uncomplicated: Secondary | ICD-10-CM | POA: Diagnosis not present

## 2020-05-09 DIAGNOSIS — R062 Wheezing: Secondary | ICD-10-CM | POA: Diagnosis not present

## 2020-05-09 DIAGNOSIS — E1165 Type 2 diabetes mellitus with hyperglycemia: Secondary | ICD-10-CM | POA: Diagnosis not present

## 2020-05-09 DIAGNOSIS — E291 Testicular hypofunction: Secondary | ICD-10-CM | POA: Diagnosis not present

## 2020-05-09 DIAGNOSIS — Z6829 Body mass index (BMI) 29.0-29.9, adult: Secondary | ICD-10-CM | POA: Diagnosis not present

## 2020-05-11 ENCOUNTER — Encounter: Payer: Self-pay | Admitting: Gastroenterology

## 2020-05-11 ENCOUNTER — Encounter: Payer: Self-pay | Admitting: *Deleted

## 2020-05-11 ENCOUNTER — Ambulatory Visit: Payer: BC Managed Care – PPO | Admitting: Gastroenterology

## 2020-05-11 ENCOUNTER — Other Ambulatory Visit (INDEPENDENT_AMBULATORY_CARE_PROVIDER_SITE_OTHER): Payer: BC Managed Care – PPO

## 2020-05-11 ENCOUNTER — Telehealth: Payer: Self-pay | Admitting: Gastroenterology

## 2020-05-11 VITALS — BP 124/76 | HR 63 | Ht 72.0 in | Wt 217.5 lb

## 2020-05-11 DIAGNOSIS — R7989 Other specified abnormal findings of blood chemistry: Secondary | ICD-10-CM | POA: Diagnosis not present

## 2020-05-11 DIAGNOSIS — R197 Diarrhea, unspecified: Secondary | ICD-10-CM | POA: Diagnosis not present

## 2020-05-11 LAB — PROTIME-INR
INR: 0.9 ratio (ref 0.8–1.0)
Prothrombin Time: 9.9 s (ref 9.6–13.1)

## 2020-05-11 LAB — TSH: TSH: 1.11 u[IU]/mL (ref 0.35–4.50)

## 2020-05-11 MED ORDER — DIPHENOXYLATE-ATROPINE 2.5-0.025 MG PO TABS: 1.0000 | ORAL_TABLET | Freq: Three times a day (TID) | ORAL | 1 refills | Status: DC | PRN

## 2020-05-11 NOTE — Telephone Encounter (Signed)
FYI. I will update medication list in chart.

## 2020-05-11 NOTE — Progress Notes (Signed)
05/11/2020 Jeffrey Matthews [id number] Apr 25, 1951   HISTORY OF PRESENT ILLNESS: This is a 69 year old male who is new to our office.  He was referred here by his PCPs office, Dr. Alecia Lemming Allwardt, PA-C, for evaluation regarding diarrhea and elevated liver enzymes.  In regards to the elevated liver enzymes it looks like on most recent labs from 4 days ago that his AST was 62 and ALT was 57.  Alk phos is normal at 94 and total bili was normal at 0.4.  He has never been told that they were elevated in the past.  He is actually scheduled for an ultrasound next month.  He does admit to alcohol use on the weekends.  Says he drinks 4-5 beers each day of the weekends, none during the week.  Has never been a heavier drinker than that except for may be a couple of years in his 29s.  He denies any family history of liver disease.  In regards to the diarrhea, they tell me that this has been present for about the past month or so.  Prior to a month ago he was having normal, regular daily bowel movements.  Now he is having the diarrhea several times a day.  Does wake up from sleep at night on occasion to have a bowel movement.  Denies any rectal bleeding.  Reports a lot of gas.  No abdominal pain per se, just discomfort related to the gas.  Afraid to pass flatus for fear of passing stool as well.  No recent antibiotics.  No other contacts with similar symptoms. His last colonoscopy said was when he was 69 years old, so 18 years ago.  Stool culture was ordered by his PCPs office and that was normal/negative.  He has tried taking Imodium, but that does not seem to help.  He is also given dicyclomine 20 mg, which he has been taking 3 times a day as well.  He says that does not seem to be helping and he only has a few pills left at this point.  His PCPs office discontinued his Metformin to see if that would help as well, but so far no improvement.  They tell me that he has been taking some type of supplement  called Relief Factor for the past couple of months to help with generalized abdominal pain.    Past Medical History:  Diagnosis Date   Asthma    COPD (chronic obstructive pulmonary disease) ([place])    Diabetes ([place])    Hyperlipidemia    Past Surgical History:  Procedure Laterality Date   COLONOSCOPY     age 56 [facility]   heart bypass      reports that he has been smoking. He has never used smokeless tobacco. He reports current alcohol use. He reports previous drug use. family history is not on file. Not on File    Outpatient Encounter Medications as of 05/11/2020  Medication Sig   albuterol (PROAIR HFA) 108 (90 Base) MCG/ACT inhaler Inhale 2 puffs into the lungs every 6 (six) hours as needed for wheezing or shortness of breath.   aspirin EC 81 MG tablet Take 81 mg by mouth every morning.   budesonide-formoterol (SYMBICORT) 160-4.5 MCG/ACT inhaler Inhale 2 puffs into the lungs as needed.    empagliflozin (JARDIANCE) 10 MG TABS tablet 1 tablet daily.   metoprolol tartrate (LOPRESSOR) 25 MG tablet Take 25 mg by mouth daily.    pramipexole (MIRAPEX) 0.75 MG tablet Take 0.75 mg  by mouth at bedtime.   sildenafil (VIAGRA) 50 MG tablet Take by mouth.   simvastatin (ZOCOR) 80 MG tablet Take 80 mg by mouth at bedtime.   testosterone cypionate (DEPOTESTOSTERONE CYPIONATE) 200 MG/ML injection Inject 100 mg into the muscle every 14 (fourteen) days.   clopidogrel (PLAVIX) 75 MG tablet Take 75 mg by mouth every morning.   [DISCONTINUED] atorvastatin (LIPITOR) 80 MG tablet Take 80 mg by mouth every morning.   [DISCONTINUED] cyclobenzaprine (FLEXERIL) 10 MG tablet Take 10 mg by mouth at bedtime as needed.   [DISCONTINUED] dicyclomine (BENTYL) 20 MG tablet Take 20 mg by mouth 3 (three) times daily.   [DISCONTINUED] lisinopril (PRINIVIL,ZESTRIL) 5 MG tablet Take 5 mg by mouth daily.   [DISCONTINUED] metFORMIN (GLUCOPHAGE-XR) 500 MG 24 hr tablet Take 2,000 mg by mouth  daily.   [DISCONTINUED] oxyCODONE (OXY IR/ROXICODONE) 5 MG immediate release tablet Take 5 mg by mouth every 4 (four) hours as needed for moderate pain or severe pain.   [DISCONTINUED] scopolamine (TRANSDERM-SCOP) 1 MG/3DAYS Place 1 patch onto the skin every 3 (three) days.   [DISCONTINUED] tamsulosin (FLOMAX) 0.4 MG CAPS capsule SMARTSIG:1 Capsule(s) By Mouth Every Evening   [DISCONTINUED] testosterone (ANDROGEL) 50 MG/5GM (1%) GEL Place 5 g onto the skin daily.   No facility-administered encounter medications on file as of 05/11/2020.     REVIEW OF SYSTEMS  : All other systems reviewed and negative except where noted in the History of Present Illness.   PHYSICAL EXAM: BP 124/76    Pulse 63    Ht 6' (1.829 m)    Wt 217 lb 8 oz (98.7 kg)    BMI 29.50 kg/m  General: Well developed white male in no acute distress Head: Normocephalic and atraumatic Eyes:  Sclerae anicteric, conjunctiva pink. Ears: Normal auditory acuity Lungs: Clear throughout to auscultation; no increased WOB Heart: Regular rate and rhythm; no M/R/G. Abdomen: Soft, non-distended.  BS present and somewhat hyperactive.  Non-tender. Musculoskeletal: Symmetrical with no gross deformities  Skin: No lesions on visible extremities Extremities: No edema  Neurological: Alert oriented x 4, grossly non- focal Psychological:  Alert and cooperative. Normal mood and affect  ASSESSMENT AND PLAN: *Diarrhea: This is a new issue that has been present for the past month.  Prior to a month ago he was having normal/regular bowel movements.  Stool culture was negative.  Not checked for C. difficile or other infectious sources.  Will check for other infectious processes including C. difficile.  If this is negative then will plan for colonoscopy since his last was 18 years ago.  Will check TSH as well.  Imodium does not seem to help.  Will send prescription for Lomotil to try.  Will try to obtain records from previous colonoscopy. *Elevated  LFTs with AST of 62 and ALT of 57.  Alk phos and total bili are normal.  He is scheduled for an ultrasound next month so we will await those results.  Question fatty liver disease.  He does drink 4-5 beers each day on the weekend so question if that is contributing.  Will check PT/INR and viral hepatitis studies for now.  **He has also been taking a supplement called Relief Factor for the past couple of months.  I have asked him to discontinue this for now to see if it has any impact on his diarrhea/bowel movements and could also be because of liver enzyme elevation.   CC:  Rory Percy, MD

## 2020-05-11 NOTE — Patient Instructions (Addendum)
If you are age 69 or older, your body mass index should be between 23-30. Your Body mass index is 29.5 kg/m. If this is out of the aforementioned range listed, please consider follow up with your Primary Care Provider.  If you are age 91 or younger, your body mass index should be between 19-25. Your Body mass index is 29.5 kg/m. If this is out of the aformentioned range listed, please consider follow up with your Primary Care Provider.   Your provider has requested that you go to the basement level for lab work before leaving today. Press "B" on the elevator. The lab is located at the first door on the left as you exit the elevator.  We have sent the following medications to your pharmacy for you to pick up at your convenience: Lomotil every 8 hours as needed.   Call back to let us know what medication you are taking specifically the Plavix.   Please send ultrasound results to our office.  Stop Relief factor.

## 2020-05-14 LAB — HEPATITIS B SURFACE ANTIGEN: Hepatitis B Surface Ag: NONREACTIVE

## 2020-05-14 LAB — HEPATITIS A ANTIBODY, TOTAL: Hepatitis A AB,Total: NONREACTIVE

## 2020-05-14 LAB — HEPATITIS C ANTIBODY
Hepatitis C Ab: NONREACTIVE
SIGNAL TO CUT-OFF: 0 (ref <1.00)

## 2020-05-14 LAB — HEPATITIS B SURFACE ANTIBODY,QUALITATIVE: Hep B S Ab: REACTIVE — AB

## 2020-05-17 NOTE — Progress Notes (Signed)
Reviewed and agree with documentation and assessment and plan. K. Veena Phillis Thackeray , MD   

## 2020-05-22 ENCOUNTER — Telehealth: Payer: Self-pay | Admitting: Gastroenterology

## 2020-05-22 ENCOUNTER — Other Ambulatory Visit: Payer: BC Managed Care – PPO

## 2020-05-22 DIAGNOSIS — R7989 Other specified abnormal findings of blood chemistry: Secondary | ICD-10-CM

## 2020-05-22 DIAGNOSIS — R197 Diarrhea, unspecified: Secondary | ICD-10-CM

## 2020-05-22 NOTE — Telephone Encounter (Signed)
The pt has an old message that I left prior to speaking with him last week.  He does advise he will turn in stool studies today.

## 2020-05-23 LAB — GI PROFILE, STOOL, PCR

## 2020-05-23 LAB — CLOSTRIDIUM DIFFICILE TOXIN B, QUALITATIVE, REAL-TIME PCR: Toxigenic C. Difficile by PCR: NOT DETECTED

## 2020-05-25 DIAGNOSIS — K7689 Other specified diseases of liver: Secondary | ICD-10-CM | POA: Diagnosis not present

## 2020-05-25 DIAGNOSIS — R7989 Other specified abnormal findings of blood chemistry: Secondary | ICD-10-CM | POA: Diagnosis not present

## 2020-05-25 DIAGNOSIS — K824 Cholesterolosis of gallbladder: Secondary | ICD-10-CM | POA: Diagnosis not present

## 2020-05-25 DIAGNOSIS — K8689 Other specified diseases of pancreas: Secondary | ICD-10-CM | POA: Diagnosis not present

## 2020-05-25 DIAGNOSIS — N281 Cyst of kidney, acquired: Secondary | ICD-10-CM | POA: Diagnosis not present

## 2020-05-31 ENCOUNTER — Telehealth: Payer: Self-pay | Admitting: Gastroenterology

## 2020-05-31 NOTE — Telephone Encounter (Signed)
See alternate note 7/14

## 2020-05-31 NOTE — Telephone Encounter (Signed)
Pt called inquiring about results of stool test. Pls call him.

## 2020-06-01 ENCOUNTER — Other Ambulatory Visit: Payer: Self-pay

## 2020-06-01 DIAGNOSIS — K8689 Other specified diseases of pancreas: Secondary | ICD-10-CM

## 2020-06-14 ENCOUNTER — Ambulatory Visit (HOSPITAL_COMMUNITY): Payer: BC Managed Care – PPO

## 2020-06-25 DIAGNOSIS — F17210 Nicotine dependence, cigarettes, uncomplicated: Secondary | ICD-10-CM | POA: Diagnosis not present

## 2020-06-25 DIAGNOSIS — J449 Chronic obstructive pulmonary disease, unspecified: Secondary | ICD-10-CM | POA: Diagnosis not present

## 2020-06-25 DIAGNOSIS — E1165 Type 2 diabetes mellitus with hyperglycemia: Secondary | ICD-10-CM | POA: Diagnosis not present

## 2020-06-25 DIAGNOSIS — I10 Essential (primary) hypertension: Secondary | ICD-10-CM | POA: Diagnosis not present

## 2020-07-02 DIAGNOSIS — Z1331 Encounter for screening for depression: Secondary | ICD-10-CM | POA: Diagnosis not present

## 2020-07-02 DIAGNOSIS — F17210 Nicotine dependence, cigarettes, uncomplicated: Secondary | ICD-10-CM | POA: Diagnosis not present

## 2020-07-02 DIAGNOSIS — E1165 Type 2 diabetes mellitus with hyperglycemia: Secondary | ICD-10-CM | POA: Diagnosis not present

## 2020-07-02 DIAGNOSIS — E291 Testicular hypofunction: Secondary | ICD-10-CM | POA: Diagnosis not present

## 2020-07-02 DIAGNOSIS — I10 Essential (primary) hypertension: Secondary | ICD-10-CM | POA: Diagnosis not present

## 2020-07-02 DIAGNOSIS — Z1389 Encounter for screening for other disorder: Secondary | ICD-10-CM | POA: Diagnosis not present

## 2020-09-27 DIAGNOSIS — E1165 Type 2 diabetes mellitus with hyperglycemia: Secondary | ICD-10-CM | POA: Diagnosis not present

## 2020-09-27 DIAGNOSIS — I10 Essential (primary) hypertension: Secondary | ICD-10-CM | POA: Diagnosis not present

## 2020-09-27 DIAGNOSIS — R739 Hyperglycemia, unspecified: Secondary | ICD-10-CM | POA: Diagnosis not present

## 2020-09-27 DIAGNOSIS — E7800 Pure hypercholesterolemia, unspecified: Secondary | ICD-10-CM | POA: Diagnosis not present

## 2020-09-27 DIAGNOSIS — R7989 Other specified abnormal findings of blood chemistry: Secondary | ICD-10-CM | POA: Diagnosis not present

## 2020-10-01 DIAGNOSIS — F17210 Nicotine dependence, cigarettes, uncomplicated: Secondary | ICD-10-CM | POA: Diagnosis not present

## 2020-10-01 DIAGNOSIS — E1165 Type 2 diabetes mellitus with hyperglycemia: Secondary | ICD-10-CM | POA: Diagnosis not present

## 2020-10-01 DIAGNOSIS — I10 Essential (primary) hypertension: Secondary | ICD-10-CM | POA: Diagnosis not present

## 2020-10-01 DIAGNOSIS — E291 Testicular hypofunction: Secondary | ICD-10-CM | POA: Diagnosis not present

## 2020-12-24 ENCOUNTER — Other Ambulatory Visit: Payer: Self-pay

## 2020-12-24 ENCOUNTER — Emergency Department (HOSPITAL_COMMUNITY)
Admission: EM | Admit: 2020-12-24 | Discharge: 2020-12-24 | Disposition: A | Discharge status: Home / Self Care | Payer: BC Managed Care – PPO | Attending: Emergency Medicine | Admitting: Emergency Medicine

## 2020-12-24 ENCOUNTER — Emergency Department (HOSPITAL_COMMUNITY): Payer: BC Managed Care – PPO

## 2020-12-24 ENCOUNTER — Encounter (HOSPITAL_COMMUNITY): Payer: Self-pay | Admitting: Emergency Medicine

## 2020-12-24 DIAGNOSIS — Z5321 Procedure and treatment not carried out due to patient leaving prior to being seen by health care provider: Secondary | ICD-10-CM | POA: Insufficient documentation

## 2020-12-24 DIAGNOSIS — R4781 Slurred speech: Secondary | ICD-10-CM | POA: Insufficient documentation

## 2020-12-24 DIAGNOSIS — R519 Headache, unspecified: Secondary | ICD-10-CM | POA: Diagnosis not present

## 2020-12-24 LAB — COMPREHENSIVE METABOLIC PANEL
ALT: 52 U/L — ABNORMAL HIGH (ref 0–44)
AST: 29 U/L (ref 15–41)
Albumin: 3.8 g/dL (ref 3.5–5.0)
Alkaline Phosphatase: 96 U/L (ref 38–126)
Anion gap: 11 (ref 5–15)
BUN: 17 mg/dL (ref 8–23)
CO2: 25 mmol/L (ref 22–32)
Calcium: 8.7 mg/dL — ABNORMAL LOW (ref 8.9–10.3)
Chloride: 100 mmol/L (ref 98–111)
Creatinine, Ser: 1.36 mg/dL — ABNORMAL HIGH (ref 0.61–1.24)
GFR, Estimated: 56 mL/min — ABNORMAL LOW (ref >60)
Glucose, Bld: 174 mg/dL — ABNORMAL HIGH (ref 70–99)
Potassium: 3.8 mmol/L (ref 3.5–5.1)
Sodium: 136 mmol/L (ref 135–145)
Total Bilirubin: 1.2 mg/dL (ref 0.3–1.2)
Total Protein: 5.6 g/dL — ABNORMAL LOW (ref 6.5–8.1)

## 2020-12-24 LAB — DIFFERENTIAL
Abs Immature Granulocytes: 0.09 10*3/uL — ABNORMAL HIGH (ref 0.00–0.07)
Basophils Absolute: 0.1 10*3/uL (ref 0.0–0.1)
Basophils Relative: 1 %
Eosinophils Absolute: 0.4 10*3/uL (ref 0.0–0.5)
Eosinophils Relative: 4 %
Immature Granulocytes: 1 %
Lymphocytes Relative: 18 %
Lymphs Abs: 1.8 10*3/uL (ref 0.7–4.0)
Monocytes Absolute: 0.5 10*3/uL (ref 0.1–1.0)
Monocytes Relative: 5 %
Neutro Abs: 7.1 10*3/uL (ref 1.7–7.7)
Neutrophils Relative %: 71 %

## 2020-12-24 LAB — CBC
HCT: 55 % — ABNORMAL HIGH (ref 39.0–52.0)
Hemoglobin: 18.2 g/dL — ABNORMAL HIGH (ref 13.0–17.0)
MCH: 32.7 pg (ref 26.0–34.0)
MCHC: 33.1 g/dL (ref 30.0–36.0)
MCV: 98.7 fL (ref 80.0–100.0)
Platelets: 150 10*3/uL (ref 150–400)
RBC: 5.57 MIL/uL (ref 4.22–5.81)
RDW: 12.2 % (ref 11.5–15.5)
WBC: 10 10*3/uL (ref 4.0–10.5)
nRBC: 0 % (ref 0.0–0.2)

## 2020-12-24 LAB — PROTIME-INR
INR: 0.9 (ref 0.8–1.2)
Prothrombin Time: 12.1 seconds (ref 11.4–15.2)

## 2020-12-24 LAB — APTT: aPTT: 26 seconds (ref 24–36)

## 2020-12-24 NOTE — ED Triage Notes (Signed)
Pt states he had a dark spot in his R visual field last Tuesday that resolved, then had one in the L visual field on Thursday that also resolved. On Saturday he had some slurred speech for approx 5 mins that also resolved. No symptoms currently, a/ox4, speech clear, face symmetrical, moves all limbs equally.

## 2021-01-02 ENCOUNTER — Other Ambulatory Visit: Payer: Self-pay | Admitting: Physician Assistant

## 2021-01-02 DIAGNOSIS — G459 Transient cerebral ischemic attack, unspecified: Secondary | ICD-10-CM

## 2021-01-02 DIAGNOSIS — H538 Other visual disturbances: Secondary | ICD-10-CM

## 2021-01-02 DIAGNOSIS — R4781 Slurred speech: Secondary | ICD-10-CM

## 2021-01-27 ENCOUNTER — Ambulatory Visit
Admission: RE | Admit: 2021-01-27 | Discharge: 2021-01-27 | Disposition: A | Discharge status: Home / Self Care | Payer: BC Managed Care – PPO | Source: Ambulatory Visit | Attending: Physician Assistant | Admitting: Physician Assistant

## 2021-01-27 DIAGNOSIS — H538 Other visual disturbances: Secondary | ICD-10-CM

## 2021-01-27 DIAGNOSIS — R4781 Slurred speech: Secondary | ICD-10-CM

## 2021-01-27 DIAGNOSIS — G459 Transient cerebral ischemic attack, unspecified: Secondary | ICD-10-CM

## 2021-02-04 ENCOUNTER — Other Ambulatory Visit: Payer: Self-pay | Admitting: Physician Assistant

## 2021-02-04 DIAGNOSIS — I6522 Occlusion and stenosis of left carotid artery: Secondary | ICD-10-CM

## 2021-02-12 DIAGNOSIS — F17210 Nicotine dependence, cigarettes, uncomplicated: Secondary | ICD-10-CM | POA: Insufficient documentation

## 2021-02-13 ENCOUNTER — Ambulatory Visit
Admission: RE | Admit: 2021-02-13 | Discharge: 2021-02-13 | Disposition: A | Discharge status: Home / Self Care | Payer: BC Managed Care – PPO | Source: Ambulatory Visit | Attending: Physician Assistant | Admitting: Physician Assistant

## 2021-02-13 ENCOUNTER — Other Ambulatory Visit: Payer: Self-pay

## 2021-02-13 ENCOUNTER — Other Ambulatory Visit: Payer: Self-pay | Admitting: *Deleted

## 2021-02-13 ENCOUNTER — Encounter: Payer: Self-pay | Admitting: *Deleted

## 2021-02-13 DIAGNOSIS — I6522 Occlusion and stenosis of left carotid artery: Secondary | ICD-10-CM

## 2021-02-13 HISTORY — PX: IR RADIOLOGIST EVAL & MGMT: IMG5224

## 2021-02-13 NOTE — Consult Note (Signed)
Chief Complaint: Speech Disturbance, Left Carotid Stenosis  Referring Physician(s): Dr. Lorie Phenix, [facility] [address] [place], [place] [address]  History of Present Illness: Jeffrey Matthews is a 70 y.o. male presenting today as a scheduled consultation to [facility], kindly referred by Dr. Lorie Phenix, for evaluation of recent slurred speech and left sided extra-cranial artery stenosis.   Jeffrey Matthews is here today in the clinic with his wife for our interview.   They tell me that in February he had an episode "on the back deck" where he was unable to form words and speak.  He did not lose consciousness.  This lasted for about 5 minutes and was witnessed by his wife.  He was evaluated in the ED at [facility]. He had CT 2/7/2 which was negative, and MRI 01/27/21. The MRI shows no acute finding, though has small distal left cervical ICA with flow signal change.    Duplex was performed 02/04/21.  This demonstrates 70%-99% stenosis of the left ICA by established duplex criteria.  The right has no significant stenosis.   In addition to the slurred speech, he describes several episodes of left eye vision changes/blurry vision which last only a few moments, but alter his ability to grasp objects.  He reports that these have started in the early calendar year, and have continued to occur. Sometimes this is associated with headache.   He denies any symptoms of claudication.   CV risk factors include: DM, Smoking, HTN, HLD, known Coronary disease.   Regarding his smoking history, he started at the age of 63.  He says he smoked until his 41's, when he quit for about 10 years.  He then restarted again, and currently reports smoking about 1/2 pack per day.  He is currently not using any cessation strategy.   His history is also positive for coronary artery disease.  He had a 3-vessel bypass performed several years ago at [facility].  He says  that it was performed for a "small heart attack" at the time.  He has never had a prior stroke.    Additional surgery of left orthopedic surgery for knee.    NKDA  Updated medication list: Albuterol Budesonide-formoterol Aspirin Clopidogrel 75mg  daily Cyclobenzaprine HCTZ Jardiance 25mg  Losartan  Metformin Metoprolol  Ozemipic Pramipexole Simvastatin Tamsulosin Testosterone cypionate   Past Medical History:  Diagnosis Date  . Asthma   . COPD (chronic obstructive pulmonary disease) (HCC)   . Diabetes (HCC)   . Hyperlipidemia     Past Surgical History:  Procedure Laterality Date  . COLONOSCOPY     age 30 [facility]  . heart bypass    . IR RADIOLOGIST EVAL & MGMT  02/13/2021    Allergies: Patient has no known allergies.  Medications: Prior to Admission medications   Medication Sig Start Date End Date Taking? Authorizing Provider  albuterol (PROAIR HFA) 108 (90 Base) MCG/ACT inhaler Inhale 2 puffs into the lungs every 6 (six) hours as needed for wheezing or shortness of breath.    [provider]  aspirin EC 81 MG tablet Take 81 mg by mouth every morning. 01/10/17   [provider]  budesonide-formoterol (SYMBICORT) 160-4.5 MCG/ACT inhaler Inhale 2 puffs into the lungs as needed.     [provider]  dicyclomine (BENTYL) 20 MG tablet Take 20 mg by mouth every 6 (six) hours.    [provider]  diphenoxylate-atropine (LOMOTIL) 2.5-0.025 MG tablet Take 1 tablet  by mouth every 8 (eight) hours as needed for diarrhea or loose stools. 05/11/20   Zehr, Princella PellegriniJessica D, PA-C  empagliflozin (JARDIANCE) 10 MG TABS tablet 1 tablet daily. 05/01/17   [provider]  metoprolol tartrate (LOPRESSOR) 25 MG tablet Take 25 mg by mouth daily.  01/13/17   [provider]  pramipexole (MIRAPEX) 0.75 MG tablet Take 0.75 mg by mouth at bedtime.    [provider]  sildenafil (VIAGRA) 50 MG tablet Take by mouth. 04/07/20    [provider]  simvastatin (ZOCOR) 80 MG tablet Take 80 mg by mouth at bedtime. 04/24/20   [provider]  testosterone cypionate (DEPOTESTOSTERONE CYPIONATE) 200 MG/ML injection Inject 100 mg into the muscle every 14 (fourteen) days.    [provider]     Family History  Problem Relation Age of Onset  . Colon cancer Neg Hx   . Esophageal cancer Neg Hx     Social History   Socioeconomic History  . Marital status: Married    Spouse name: Not on file  . Number of children: Not on file  . Years of education: Not on file  . Highest education level: Not on file  Occupational History  . Not on file  Tobacco Use  . Smoking status: Current Every Day Smoker  . Smokeless tobacco: Never Used  Vaping Use  . Vaping Use: Never used  Substance and Sexual Activity  . Alcohol use: Yes  . Drug use: Not Currently  . Sexual activity: Not on file  Other Topics Concern  . Not on file  Social History Narrative  . Not on file   Social Determinants of Health   Financial Resource Strain: Not on file  Food Insecurity: Not on file  Transportation Needs: Not on file  Physical Activity: Not on file  Stress: Not on file  Social Connections: Not on file       Review of Systems: A 12 point ROS discussed and pertinent positives are indicated in the HPI above.  All other systems are negative.  Review of Systems  Vital Signs: There were no vitals taken for this visit.  Physical Exam General: 70 yo male appearing stated age.  Well-developed, well-nourished.  No distress. HEENT: Atraumatic, normocephalic.  Conjugate gaze, extra-ocular motor intact. No scleral icterus or scleral injection. No lesions on external ears, nose, lips, or gums.  Oral mucosa moist, pink.  Neck: Symmetric with no goiter enlargement.  No bruit appreciated Chest/Lungs:  Symmetric chest with inspiration/expiration.  No labored breathing.  Clear to auscultation with no wheezes, rhonchi, or rales.   Heart:  RRR, with no third heart sounds appreciated. No JVD appreciated.  Abdomen:  Soft, NT/ND, with + bowel sounds.   Genito-urinary: Deferred Neurologic: Alert & Oriented to person, place, and time.   Normal affect and insight.  Appropriate questions. Cranial Nerves 2-12 intact. Symmetric smile   Strength 5/5 of bilateral upper extremity shoulders, elbow flex/ext, wrist ext/flex 5/5 strength of bilateral lower extremity hip flex/ext, knee flex/ext, ankle flex/ext   Mallampati Score:     Imaging: MR BRAIN WO CONTRAST  Result Date: 01/27/2021 CLINICAL DATA:  Headache and blurred vision with hand numbness and difficulty talking for 1 month. EXAM: MRI HEAD WITHOUT CONTRAST TECHNIQUE: Multiplanar, multiecho pulse sequences of the brain and surrounding structures were obtained without intravenous contrast. COMPARISON:  12/24/2020 head CT FINDINGS: Brain: No acute or subacute infarction, hemorrhage, hydrocephalus, extra-axial collection or mass lesion. 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  Chronic small vessel ischemia in the cerebral white matter that is mild. Age normal brain volume. Vascular: Very diminutive left cervical and cavernous ICA with normalized appearance by the supraclinoid segment. Skull and upper cervical spine: Normal marrow signal Sinuses/Orbits: Mucosal thickening in paranasal sinuses especially affecting posterior ethmoid and left maxillary sinuses. Secretions are seen in the left maxillary sinus. Negative orbits. IMPRESSION: 1. No acute intracranial finding. 2. Diminutive left cervical and cavernous ICA, recommend ultrasound or CTA to evaluate for flow reducing stenosis in the neck. 3. Ethmoid and left maxillary sinus inflammation. 4. Mild chronic small vessel ischemia in the cerebral white matter. Small remote cerebellar infarcts. Electronically Signed   By: Marnee Spring M.D.   On: 01/27/2021 21:03   IR Radiologist Eval & Mgmt  Result Date: 02/13/2021 Please  refer to notes tab for details about interventional procedure. (Op Note)   Labs:  CBC: Recent Labs    12/24/20 1302  WBC 10.0  HGB 18.2*  HCT 55.0*  PLT 150    COAGS: Recent Labs    05/11/20 1038 12/24/20 1302  INR 0.9 0.9  APTT  --  26    BMP: Recent Labs    12/24/20 1302  NA 136  K 3.8  CL 100  CO2 25  GLUCOSE 174*  BUN 17  CALCIUM 8.7*  CREATININE 1.36*  GFRNONAA 56*    LIVER FUNCTION TESTS: Recent Labs    12/24/20 1302  BILITOT 1.2  AST 29  ALT 52*  ALKPHOS 96  PROT 5.6*  ALBUMIN 3.8    TUMOR MARKERS: No results for input(s): AFPTM, CEA, CA199, CHROMGRNA in the last 8760 hours.  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  The index event occurred 12/24/20, with TIA, and symptoms have resolved, with current mRS of 0.  Given his medical history, he is in high-risk surgical category.    Non-invasive imaging with duplex demonstrates 70%-99% stenosis of the left ICA. MRI brain demonstrates no evidence of infarction.    I had a lengthy discussion with Mr. Rainford and his wife regarding the definition and epidemiology of stroke/TIA, associated cardiovascular risk factors, pathology/pathophysiology, the natural history of extracranial carotid arterial disease/risk of recurrent TIA/stroke, and the goals of therapy in this setting. Specifically, reducing risk of recurrent stroke/disability.  We discussed known association of extracranial carotid atherosclerotic disease and ipsilateral stroke risk, with causative risk generally accepted as ~15%.  We discussed the risk of recurrent stroke/TIA after the initial event, accepted to be higher in the first 90 days.  The risk may be between 3 and 20%, acknowledging that Intensive Medical Management (IMM) therapy has improved over time (SAMMPRIS trial, Rationale of CREST-2 IMM) and the risk is probably on the lower  end of this spectrum, dependent on adherence to IMM and other individualized factors.  This introduces the contemporary concept of intensive medical management (IMM), now considered the synergistic benefit of multiple continuous and life-long approaches to stroke risk factor reduction and behavioral modification, endorsed by the American Heart Association.   Recommendations from American Heart Association (AHA Guidelines, Kleindorfer) and other multi-disciplinary guidelines were emphasized, including: Lifestyle modification (reduce sodium, physical activity/weight reduction, limit ETOH/illicit substances), Treatment of hypertension (targeting =/< 130-140/90); High-intensity statin therapy (targeting LDL < 100 (optimally < 70); Anti-platelet therapy; Tobacco cessation; blood-glucose control (targeting hgb A1C <7.0).  With regard to smoking cessation, he seems willing/unwilling to attempt  complete cessation at this time.  Various strategies were discussed.   Further, we discussed interventional strategies as protective against recurrent stroke, including the history/evolution of carotid endarterectomy (CEA) and carotid artery stenting (CAS), and the indication and role of revascularization.   I described the evolution of revascularization/CEA, proven by early surgical trials to be protective against ipsilateral stroke, with relative risk reduction of almost 50% in high grade stenosis. CAS was then introduced as an alternative, minimally-invasive modality.  We discussed clinical equipoise that is currently accepted with CEA and CAS, including CREST Trial 5-year follow up citing <3% risk for both (CREST Long Term), 5-year cumulative risk of 6.4%/CAS vs 6.5%/CEA cited by ICSS, and the equivalent 5-year stroke-free survival reported by ACT-1.  We discussed the major points of the CREST trial regarding peri-operative risk, which was determined to be no different, approximately 6% risk for both CAS and CEA.  This  included a discussion of the individual endpoints of myocardial infarction, stroke, death.    Finally, I did share my impression with Mr. Silas that he is a candidate for revascularization based on history and duplex, with the ultimate goal of minimizing peri-procedural/peri-operative stroke to achieve lowest possible risk/co-morbidity long-term.    After our discussion, given his symptomatic high-grade stenosis with high surgical risk, we have recommended cervical/cerebral angiogram with possible carotid stenting as therapy, with which he agrees.   Plan: -       Continue intensive medical management, as above, including dual anti-platelet therapy.  -       Plan for cervical and cerebral angiogram with possible left carotid stent, with Dr. Loreta Ave, at [facility].   -       Regarding intensive medical management, I discussed with him the need for smoking cessation.  Strategies were discussed. - p2Y12 lab draw    CC of this report to  Dr. Lorie Phenix, [facility]     1 -  Rationale for IMM - Freddie Breech, et al. PMID: 74081448 2 - AHA Guidelines, Kleindorfer DO, et al. PMID: 18563149 3 - CREST Long-Term Brott TG, et al. PMID: 70263785 4 - ICSS, Bonati elt al. PMID: 88502774 5 - ACT -1, Rosenfield et al. PMID: PMID: 12878676  Thank you for this interesting consult.  I greatly enjoyed meeting Shepherd D Tolsma and look forward to participating in their care.  A copy of this report was sent to the requesting provider on this date.  Electronically Signed: Gilmer Mor 02/13/2021, 3:38 PM   I spent a total of  60 Minutes   in face to face in clinical consultation, greater than 50% of which was counseling/coordinating care for symptomatic, high grade left cervical stenosis, with high surgical risk, possible angiogram with possible carotid artery stent.

## 2021-02-19 ENCOUNTER — Other Ambulatory Visit (HOSPITAL_COMMUNITY): Payer: Self-pay | Admitting: Interventional Radiology

## 2021-02-19 DIAGNOSIS — I771 Stricture of artery: Secondary | ICD-10-CM

## 2021-02-25 ENCOUNTER — Other Ambulatory Visit (HOSPITAL_COMMUNITY)
Admission: RE | Admit: 2021-02-25 | Discharge: 2021-02-25 | Disposition: A | Discharge status: Home / Self Care | Payer: BC Managed Care – PPO | Source: Ambulatory Visit | Attending: Interventional Radiology | Admitting: Interventional Radiology

## 2021-02-25 ENCOUNTER — Other Ambulatory Visit (HOSPITAL_COMMUNITY)
Admission: RE | Admit: 2021-02-25 | Discharge: 2021-02-25 | Disposition: A | Discharge status: Home / Self Care | Payer: BC Managed Care – PPO

## 2021-02-25 ENCOUNTER — Other Ambulatory Visit (HOSPITAL_COMMUNITY): Payer: Self-pay | Admitting: Radiology

## 2021-02-25 DIAGNOSIS — I6522 Occlusion and stenosis of left carotid artery: Secondary | ICD-10-CM

## 2021-02-25 DIAGNOSIS — Z20822 Contact with and (suspected) exposure to covid-19: Secondary | ICD-10-CM | POA: Insufficient documentation

## 2021-02-25 DIAGNOSIS — Z01812 Encounter for preprocedural laboratory examination: Secondary | ICD-10-CM | POA: Insufficient documentation

## 2021-02-25 LAB — PLATELET INHIBITION P2Y12: Platelet Function  P2Y12: 103 [PRU] — ABNORMAL LOW (ref 182–335)

## 2021-02-26 ENCOUNTER — Other Ambulatory Visit: Payer: Self-pay | Admitting: Radiology

## 2021-02-26 ENCOUNTER — Encounter (HOSPITAL_COMMUNITY): Payer: Self-pay | Admitting: Interventional Radiology

## 2021-02-26 ENCOUNTER — Other Ambulatory Visit: Payer: Self-pay | Admitting: Gastroenterology

## 2021-02-26 ENCOUNTER — Other Ambulatory Visit: Payer: Self-pay | Admitting: Student

## 2021-02-26 LAB — SARS CORONAVIRUS 2 (TAT 6-24 HRS): SARS Coronavirus 2: NEGATIVE

## 2021-02-26 NOTE — Progress Notes (Signed)
Spoke with pt's wife, Bjorn Loser for pre-op call. Pt has hx of a CABG in 2018, denies any recent chest pain or shortness of breath. Pt is a type 2 diabetic. Last A1C was just above 7.0 per pt. States his fasting blood sugar has been around 200 - 230, states he doesn't check it every day.   Pt is on Aspirin and Plavix, but he and his wife state that they were told not to take any medications the morning of surgery by Dr. Loreta Ave.  Covid test done 02/25/21 and it's negative.  Bjorn Loser states pt has been in quarantine since the test was done and understands that he stays in quarantine until he comes to the hospital tomorrow.

## 2021-02-26 NOTE — H&P (Signed)
Chief Complaint: Patient was seen in consultation today for cervical and cerebral angiogram with possible left carotid stent placement under general anesthesia.  Referring Physician(s): Lorie Phenix, MD (PCP)  Supervising Physician: Gilmer Mor  Patient Status: [facility] - Out-pt  History of Present Illness: Jeffrey Matthews is a 70 y.o. male with a past medical history significant for tobacco use, asthma, COPD, DM, HLD, CAD s/p 3-vessel bypass and left ICA stenosis who presents today for a cervical and cerebral angiogram with possible left ICA stenting. [name] was seen in consultation by Dr. Loreta Ave on 02/13/21 - please see this consult note for full details. Briefly, in February of this year Mr. [name] had a brief episode (~5 minutes) where he was unable to form words and speak, he was taken to the ED at [facility] where imaging showed no acute findings and he was subsequently discharged with outpatient follow up. He had an MRI on 3/13 which showed a small distal left cervical ICA with flow signal change. Duplex on 3/21 showed 70-99% stenosis of the left ICA. He was deemed a high risk surgical candidate and was referred to IR to discuss possible intervention. After thorough discussion he has decided to proceed with cervical and cerebral angiogram and possible left carotid stent placement with general anesthesia for which he presents today.  Patient seen at bedside with Dr. Loreta Ave. [name] denies any complaints today, he has been feeling well overall. He last took his Plavix and ASA last night. He has not had any issues with abnormal bleeding. He understands the procedure today and that he will be staying overnight for observation.   Past Medical History:  Diagnosis Date  . Asthma   . COPD (chronic obstructive pulmonary disease) (HCC)   . Diabetes (HCC)   . Hyperlipidemia     Past Surgical History:  Procedure Laterality Date  . COLONOSCOPY     age 47 [facility]  . heart bypass     . IR RADIOLOGIST EVAL & MGMT  02/13/2021    Allergies: Patient has no known allergies.  Medications: Prior to Admission medications   Medication Sig Start Date End Date Taking? Authorizing Provider  albuterol (VENTOLIN HFA) 108 (90 Base) MCG/ACT inhaler Inhale 2 puffs into the lungs every 6 (six) hours as needed for wheezing or shortness of breath.    [provider]  aspirin EC 81 MG tablet Take 81 mg by mouth every morning. 01/10/17   [provider]  budesonide-formoterol (SYMBICORT) 160-4.5 MCG/ACT inhaler Inhale 2 puffs into the lungs daily as needed (asthma).    [provider]  cyclobenzaprine (FLEXERIL) 10 MG tablet Take 1 tablet by mouth at bedtime. 12/01/20   [provider]  empagliflozin (JARDIANCE) 25 MG TABS tablet Take 25 mg by mouth daily. 05/01/17   [provider]  hydrochlorothiazide (HYDRODIURIL) 25 MG tablet Take 25 mg by mouth daily.    [provider]  losartan (COZAAR) 25 MG tablet Take 25 mg by mouth daily. 12/24/20   [provider]  metFORMIN (GLUCOPHAGE) 500 MG tablet Take 500 mg by mouth daily with breakfast. 02/04/21   [provider]  metoprolol tartrate (LOPRESSOR) 25 MG tablet Take 25 mg by mouth 2 (two) times daily. 01/13/17   [provider]  OZEMPIC, 1 MG/DOSE, 4 MG/3ML SOPN Inject 1 mg into the muscle every Monday. 12/31/20   [provider]  pramipexole (MIRAPEX) 0.75 MG tablet Take 0.75 mg by mouth at bedtime.    [provider]  sildenafil (VIAGRA) 50 MG tablet Take 50 mg by mouth as needed for erectile dysfunction. 04/07/20   [provider]  simvastatin (ZOCOR) 80 MG tablet Take 80 mg by mouth at bedtime. 04/24/20   [provider]  tamsulosin (FLOMAX) 0.4 MG CAPS capsule Take 0.4 mg by mouth at bedtime.    [provider]  testosterone cypionate (DEPOTESTOSTERONE CYPIONATE) 200 MG/ML injection Inject 100 mg into the muscle every 14  (fourteen) days.    [provider]     Family History  Problem Relation Age of Onset  . Colon cancer Neg Hx   . Esophageal cancer Neg Hx     Social History   Socioeconomic History  . Marital status: Married    Spouse name: Not on file  . Number of children: Not on file  . Years of education: Not on file  . Highest education level: Not on file  Occupational History  . Not on file  Tobacco Use  . Smoking status: Current Every Day Smoker  . Smokeless tobacco: Never Used  Vaping Use  . Vaping Use: Never used  Substance and Sexual Activity  . Alcohol use: Yes  . Drug use: Not Currently  . Sexual activity: Not on file  Other Topics Concern  . Not on file  Social History Narrative  . Not on file   Social Determinants of Health   Financial Resource Strain: Not on file  Food Insecurity: Not on file  Transportation Needs: Not on file  Physical Activity: Not on file  Stress: Not on file  Social Connections: Not on file     Review of Systems: A 12 point ROS discussed and pertinent positives are indicated in the HPI above.  All other systems are negative.  Review of Systems  Constitutional: Negative for appetite change, chills, fatigue and fever.  HENT: Negative for nosebleeds.   Respiratory: Negative for cough and shortness of breath.   Cardiovascular: Negative for chest pain and leg swelling.  Gastrointestinal: Negative for abdominal pain, blood in stool, constipation, diarrhea, nausea and vomiting.  Musculoskeletal: Negative for back pain.  Skin: Negative for color change and rash.  Neurological: Negative for dizziness, speech difficulty, weakness, light-headedness, numbness and headaches.  Psychiatric/Behavioral: Negative for confusion.    Vital Signs: BP (!) 141/86   Pulse 86   Temp 98.5 F (36.9 C) (Oral)   Resp 18   Ht 6\' 1"  (1.854 m)   Wt 205 lb (93 kg)   SpO2 100%   BMI 27.05 kg/m   Physical Exam Vitals and nursing note reviewed.   Constitutional:      General: He is not in acute distress. HENT:     Head: Normocephalic.     Mouth/Throat:     Mouth: Mucous membranes are moist.     Pharynx: Oropharynx is clear. No oropharyngeal exudate or posterior oropharyngeal erythema.  Cardiovascular:     Rate and Rhythm: Normal rate and regular rhythm.  Pulmonary:     Effort: Pulmonary effort is normal.     Breath sounds: Normal breath sounds.  Abdominal:     General: There is no distension.     Palpations: Abdomen is soft.     Tenderness: There is no abdominal tenderness.  Skin:    General: Skin is warm and dry.  Neurological:     Mental Status: He is alert.   Alert, awake, and oriented x 3 Speech and comprehension in tact PERRL bilaterally EOMs without nystagmus or  subjective diplopia. Visual fields grossly intact No facial asymmetry. Tongue midline Motor power full in all 4 extremities Negative pronator drift. Fine motor and coordination grossly in tact Gait not assessed Romberg not assessed Heel to toe not assessed Distal pulses not assessed    Imaging: IR Radiologist Eval & Mgmt  Result Date: 02/13/2021 Please refer to notes tab for details about interventional procedure. (Op Note)   Labs:  CBC: Recent Labs    12/24/20 1302  WBC 10.0  HGB 18.2*  HCT 55.0*  PLT 150    COAGS: Recent Labs    05/11/20 1038 12/24/20 1302  INR 0.9 0.9  APTT  --  26    BMP: Recent Labs    12/24/20 1302  NA 136  K 3.8  CL 100  CO2 25  GLUCOSE 174*  BUN 17  CALCIUM 8.7*  CREATININE 1.36*  GFRNONAA 56*    LIVER FUNCTION TESTS: Recent Labs    12/24/20 1302  BILITOT 1.2  AST 29  ALT 52*  ALKPHOS 96  PROT 5.6*  ALBUMIN 3.8    TUMOR MARKERS: No results for input(s): AFPTM, CEA, CA199, CHROMGRNA in the last 8760 hours.  Assessment and Plan:  70 y/o M with history of left ICA stenosis and previous TIA who presents today for an image guided cervical and cerebral angiogram with possible  stent placement under general anesthesia.   Patient has been NPO since midnight, currently taking Plavix 75 mg PO + ASA 325 mg PO with last dose yesterday evening. Afebrile, WBC 6.0, hgb 16.6, plt 111, INR 1.0, creatinine 1.16, p2y12 103 (4/11) and COVID (-). Will give loading dose of Plavix 300 mg + ASA 650 mg x 1 prior to procedure. Patient to be admitted for overnight observation, IR to follow and determine discharge when appropriate.  Risks and benefits of cervical and cerebral arteriogram with possible intervention were discussed with the patient including, but not limited to bleeding, infection, vascular injury, contrast induced renal failure, stroke, reperfusion hemorrhage, or even death.  This interventional procedure involves the use of X-rays and because of the nature of the planned procedure, it is possible that we will have prolonged use of X-ray fluoroscopy.  Potential radiation risks to you include (but are not limited to) the following: - A slightly elevated risk for cancer  several years later in life. This risk is typically less than 0.5% percent. This risk is low in comparison to the normal incidence of human cancer, which is 33% for women and 50% for men according to the American Cancer Society. - Radiation induced injury can include skin redness, resembling a rash, tissue breakdown / ulcers and hair loss (which can be temporary or permanent).  The likelihood of either of these occurring depends on the difficulty of the procedure and whether you are sensitive to radiation due to previous procedures, disease, or genetic conditions.  IF your procedure requires a prolonged use of radiation, you will be notified and given written instructions for further action.  It is your responsibility to monitor the irradiated area for the 2 weeks following the procedure and to notify your physician if you are concerned that you have suffered a radiation induced injury.    All of the patient's  questions were answered, patient is agreeable to proceed.  Consent signed and in chart.  Thank you for this interesting consult.  I greatly enjoyed meeting Christino D Graefe and look forward to participating in their care.  A copy of this report  was sent to the requesting provider on this date.  Electronically Signed: Villa Herb, PA-C 02/26/2021, 4:04 PM   I spent a total of 25 Minutes in face to face in clinical consultation, greater than 50% of which was counseling/coordinating care for cervical/cerebral angiogram with possible intervention.

## 2021-02-27 ENCOUNTER — Encounter (HOSPITAL_COMMUNITY): Payer: Self-pay | Admitting: Interventional Radiology

## 2021-02-27 ENCOUNTER — Inpatient Hospital Stay (HOSPITAL_COMMUNITY): Payer: BC Managed Care – PPO | Admitting: Certified Registered"

## 2021-02-27 ENCOUNTER — Other Ambulatory Visit: Payer: Self-pay

## 2021-02-27 ENCOUNTER — Encounter (HOSPITAL_COMMUNITY)
Admission: RE | Disposition: A | Discharge status: Still In Hospital | Payer: Self-pay | Source: Home / Self Care | Attending: Interventional Radiology

## 2021-02-27 ENCOUNTER — Inpatient Hospital Stay (HOSPITAL_COMMUNITY)
Admission: RE | Admit: 2021-02-27 | Discharge: 2021-02-27 | DRG: 068 | Disposition: A | Discharge status: Home / Self Care | Payer: BC Managed Care – PPO | Attending: Interventional Radiology | Admitting: Interventional Radiology

## 2021-02-27 ENCOUNTER — Inpatient Hospital Stay (HOSPITAL_COMMUNITY): Payer: BC Managed Care – PPO

## 2021-02-27 ENCOUNTER — Other Ambulatory Visit (HOSPITAL_COMMUNITY): Payer: BC Managed Care – PPO

## 2021-02-27 ENCOUNTER — Ambulatory Visit (HOSPITAL_COMMUNITY): Admission: RE | Admit: 2021-02-27 | Payer: BC Managed Care – PPO | Source: Ambulatory Visit

## 2021-02-27 DIAGNOSIS — Z7982 Long term (current) use of aspirin: Secondary | ICD-10-CM | POA: Diagnosis not present

## 2021-02-27 DIAGNOSIS — I10 Essential (primary) hypertension: Secondary | ICD-10-CM | POA: Diagnosis present

## 2021-02-27 DIAGNOSIS — I2510 Atherosclerotic heart disease of native coronary artery without angina pectoris: Secondary | ICD-10-CM | POA: Diagnosis present

## 2021-02-27 DIAGNOSIS — Z20822 Contact with and (suspected) exposure to covid-19: Secondary | ICD-10-CM | POA: Diagnosis present

## 2021-02-27 DIAGNOSIS — Z8673 Personal history of transient ischemic attack (TIA), and cerebral infarction without residual deficits: Secondary | ICD-10-CM

## 2021-02-27 DIAGNOSIS — I6522 Occlusion and stenosis of left carotid artery: Secondary | ICD-10-CM | POA: Diagnosis present

## 2021-02-27 DIAGNOSIS — E119 Type 2 diabetes mellitus without complications: Secondary | ICD-10-CM | POA: Diagnosis present

## 2021-02-27 DIAGNOSIS — I252 Old myocardial infarction: Secondary | ICD-10-CM

## 2021-02-27 DIAGNOSIS — Z7984 Long term (current) use of oral hypoglycemic drugs: Secondary | ICD-10-CM | POA: Diagnosis not present

## 2021-02-27 DIAGNOSIS — F17210 Nicotine dependence, cigarettes, uncomplicated: Secondary | ICD-10-CM | POA: Diagnosis present

## 2021-02-27 DIAGNOSIS — Z7951 Long term (current) use of inhaled steroids: Secondary | ICD-10-CM | POA: Diagnosis not present

## 2021-02-27 DIAGNOSIS — I771 Stricture of artery: Secondary | ICD-10-CM

## 2021-02-27 DIAGNOSIS — E785 Hyperlipidemia, unspecified: Secondary | ICD-10-CM | POA: Diagnosis present

## 2021-02-27 DIAGNOSIS — J449 Chronic obstructive pulmonary disease, unspecified: Secondary | ICD-10-CM | POA: Diagnosis present

## 2021-02-27 HISTORY — DX: Other complications of anesthesia, initial encounter: T88.59XA

## 2021-02-27 HISTORY — PX: IR ANGIO INTRA EXTRACRAN SEL COM CAROTID INNOMINATE BILAT MOD SED: IMG5360

## 2021-02-27 HISTORY — DX: Restless legs syndrome: G25.81

## 2021-02-27 HISTORY — DX: Essential (primary) hypertension: I10

## 2021-02-27 HISTORY — DX: Headache, unspecified: R51.9

## 2021-02-27 HISTORY — DX: Acute myocardial infarction, unspecified: I21.9

## 2021-02-27 HISTORY — DX: Atherosclerotic heart disease of native coronary artery without angina pectoris: I25.10

## 2021-02-27 HISTORY — PX: IR US GUIDE VASC ACCESS RIGHT: IMG2390

## 2021-02-27 HISTORY — PX: RADIOLOGY WITH ANESTHESIA: SHX6223

## 2021-02-27 HISTORY — PX: IR ANGIO VERTEBRAL SEL SUBCLAVIAN INNOMINATE BILAT MOD SED: IMG5366

## 2021-02-27 HISTORY — DX: Cerebral infarction, unspecified: I63.9

## 2021-02-27 HISTORY — DX: Unspecified osteoarthritis, unspecified site: M19.90

## 2021-02-27 HISTORY — DX: Fatty (change of) liver, not elsewhere classified: K76.0

## 2021-02-27 LAB — CBC
HCT: 48.3 % (ref 39.0–52.0)
Hemoglobin: 16.6 g/dL (ref 13.0–17.0)
MCH: 32.5 pg (ref 26.0–34.0)
MCHC: 34.4 g/dL (ref 30.0–36.0)
MCV: 94.7 fL (ref 80.0–100.0)
Platelets: 111 10*3/uL — ABNORMAL LOW (ref 150–400)
RBC: 5.1 MIL/uL (ref 4.22–5.81)
RDW: 12.3 % (ref 11.5–15.5)
WBC: 6 10*3/uL (ref 4.0–10.5)
nRBC: 0 % (ref 0.0–0.2)

## 2021-02-27 LAB — BASIC METABOLIC PANEL
Anion gap: 12 (ref 5–15)
BUN: 18 mg/dL (ref 8–23)
CO2: 22 mmol/L (ref 22–32)
Calcium: 8.9 mg/dL (ref 8.9–10.3)
Chloride: 101 mmol/L (ref 98–111)
Creatinine, Ser: 1.16 mg/dL (ref 0.61–1.24)
GFR, Estimated: >60 mL/min (ref >60)
Glucose, Bld: 138 mg/dL — ABNORMAL HIGH (ref 70–99)
Potassium: 3.2 mmol/L — ABNORMAL LOW (ref 3.5–5.1)
Sodium: 135 mmol/L (ref 135–145)

## 2021-02-27 LAB — PROTIME-INR
INR: 1 (ref 0.8–1.2)
Prothrombin Time: 12.7 seconds (ref 11.4–15.2)

## 2021-02-27 LAB — GLUCOSE, CAPILLARY
Glucose-Capillary: 143 mg/dL — ABNORMAL HIGH (ref 70–99)
Glucose-Capillary: 142 mg/dL — ABNORMAL HIGH (ref 70–99)

## 2021-02-27 SURGERY — MRI WITH ANESTHESIA
Anesthesia: General

## 2021-02-27 MED ORDER — METOPROLOL TARTRATE 12.5 MG HALF TABLET: 25.0000 mg | ORAL_TABLET | Freq: Once | ORAL | Status: AC

## 2021-02-27 MED ORDER — CLOPIDOGREL BISULFATE 300 MG PO TABS
300.0000 mg | ORAL_TABLET | Freq: Once | ORAL | Status: AC
  Administered 2021-02-27: 300 mg via ORAL
  Filled 2021-02-27: qty 1

## 2021-02-27 MED ORDER — IOHEXOL 300 MG/ML  SOLN
150.0000 mL | Freq: Once | INTRAMUSCULAR | Status: AC | PRN
  Administered 2021-02-27: 80 mL

## 2021-02-27 MED ORDER — LACTATED RINGERS IV SOLN: INTRAVENOUS | Status: DC | PRN

## 2021-02-27 MED ORDER — ACETAMINOPHEN 325 MG PO TABS
ORAL_TABLET | ORAL | Status: AC
  Filled 2021-02-27: qty 2

## 2021-02-27 MED ORDER — ASPIRIN 325 MG PO TABS
650.0000 mg | ORAL_TABLET | Freq: Once | ORAL | Status: AC
  Administered 2021-02-27: 650 mg via ORAL
  Filled 2021-02-27: qty 2

## 2021-02-27 MED ORDER — METOPROLOL TARTRATE 12.5 MG HALF TABLET
ORAL_TABLET | ORAL | Status: AC
  Administered 2021-02-27: 25 mg via ORAL
  Filled 2021-02-27: qty 2

## 2021-02-27 MED ORDER — PHENYLEPHRINE HCL-NACL 10-0.9 MG/250ML-% IV SOLN
INTRAVENOUS | Status: DC | PRN
  Administered 2021-02-27: 30 ug/min via INTRAVENOUS

## 2021-02-27 MED ORDER — CHLORHEXIDINE GLUCONATE 0.12 % MT SOLN: 15.0000 mL | Freq: Once | OROMUCOSAL | Status: AC

## 2021-02-27 MED ORDER — SUGAMMADEX SODIUM 200 MG/2ML IV SOLN
INTRAVENOUS | Status: DC | PRN
  Administered 2021-02-27: 200 mg via INTRAVENOUS

## 2021-02-27 MED ORDER — LIDOCAINE 2% (20 MG/ML) 5 ML SYRINGE
INTRAMUSCULAR | Status: DC | PRN
  Administered 2021-02-27: 60 mg via INTRAVENOUS

## 2021-02-27 MED ORDER — CHLORHEXIDINE GLUCONATE 0.12 % MT SOLN
OROMUCOSAL | Status: AC
  Administered 2021-02-27: 15 mL via OROMUCOSAL
  Filled 2021-02-27: qty 15

## 2021-02-27 MED ORDER — DEXAMETHASONE SODIUM PHOSPHATE 10 MG/ML IJ SOLN
INTRAMUSCULAR | Status: DC | PRN
  Administered 2021-02-27: 10 mg via INTRAVENOUS

## 2021-02-27 MED ORDER — ASPIRIN EC 325 MG PO TBEC
DELAYED_RELEASE_TABLET | ORAL | Status: AC
  Filled 2021-02-27: qty 2

## 2021-02-27 MED ORDER — ONDANSETRON HCL 4 MG/2ML IJ SOLN
INTRAMUSCULAR | Status: DC | PRN
  Administered 2021-02-27: 4 mg via INTRAVENOUS

## 2021-02-27 MED ORDER — SODIUM CHLORIDE 0.9 % IV SOLN: INTRAVENOUS | Status: DC

## 2021-02-27 MED ORDER — CLOPIDOGREL BISULFATE 75 MG PO TABS
ORAL_TABLET | ORAL | Status: AC
  Filled 2021-02-27: qty 4

## 2021-02-27 MED ORDER — ROCURONIUM BROMIDE 10 MG/ML (PF) SYRINGE
PREFILLED_SYRINGE | INTRAVENOUS | Status: DC | PRN
  Administered 2021-02-27: 50 mg via INTRAVENOUS

## 2021-02-27 MED ORDER — IOHEXOL 300 MG/ML  SOLN
150.0000 mL | Freq: Once | INTRAMUSCULAR | Status: AC | PRN
  Administered 2021-02-27: 50 mL

## 2021-02-27 MED ORDER — MIDAZOLAM HCL 5 MG/5ML IJ SOLN
INTRAMUSCULAR | Status: DC | PRN
  Administered 2021-02-27 (×2): 1 mg via INTRAVENOUS

## 2021-02-27 MED ORDER — PROPOFOL 10 MG/ML IV BOLUS
INTRAVENOUS | Status: DC | PRN
  Administered 2021-02-27: 120 mg via INTRAVENOUS

## 2021-02-27 MED ORDER — LIDOCAINE HCL 1 % IJ SOLN
INTRAMUSCULAR | Status: AC
  Filled 2021-02-27: qty 20

## 2021-02-27 MED ORDER — FENTANYL CITRATE (PF) 250 MCG/5ML IJ SOLN
INTRAMUSCULAR | Status: DC | PRN
  Administered 2021-02-27: 100 ug via INTRAVENOUS

## 2021-02-27 NOTE — Procedures (Addendum)
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 The narrowed channel long segment, with the narrowest <50m, with irregularity of soft plaque.   Excellent right to left cross-flow through the COW.   Did not proceed with stenting    Complications: None  Recommendations:  - right hip straight x 4hrs - routine wound care - Stable to PACU - Oriented on awakening - advance diet - continue DAPT - Continue smoking cessation effort - Do not submerge right hip for 7 days - Follow up VIR in clinic in 2-4 weeks - Plan for DC today when goals met - hold metformin x 48 hours  Signed,  Valene Villa S. WEarleen Newport DO

## 2021-02-27 NOTE — Anesthesia Postprocedure Evaluation (Signed)
Anesthesia Post Note  Patient: Jeffrey Matthews  Procedure(s) Performed: STENT PLACEMENT (N/A )     Patient location during evaluation: PACU Anesthesia Type: General Level of consciousness: awake Pain management: pain level controlled Vital Signs Assessment: post-procedure vital signs reviewed and stable Respiratory status: spontaneous breathing Cardiovascular status: stable Postop Assessment: no apparent nausea or vomiting Anesthetic complications: no   No complications documented.  Last Vitals:  Vitals:   02/27/21 1030 02/27/21 1045  BP: 109/70 118/69  Pulse: 77 76  Resp: 17 13  Temp:    SpO2: 97% 95%    Last Pain:  Vitals:   02/27/21 1045  TempSrc:   PainSc: 0-No pain                 Eneida Evers

## 2021-02-27 NOTE — Sedation Documentation (Signed)
6 Fr Angioseal deployed 

## 2021-02-27 NOTE — Sedation Documentation (Signed)
Pt transported to PACU bay 23 via stretcher accompanied by RN and CRNA. Philip RN at bedside to receive pt. Handoff completed. No s/s of distress at this time. Pt drowsy but responsive and following commands. Right groin site level 0 and pulses present on bilateral ankle and feet via doppler. Small amount of bruising noted below the dressing site that was already present before procedure, confirmed by MD.

## 2021-02-27 NOTE — Discharge Instructions (Signed)
Femoral Site Care  This sheet gives you information about how to care for yourself after your procedure. Your health care provider may also give you more specific instructions. If you have problems or questions, contact your health care provider. What can I expect after the procedure? After the procedure, it is common to have:  Bruising that usually fades within 1-2 weeks.  Tenderness at the site. Follow these instructions at home: Wound care  Follow instructions from your health care provider about how to take care of your insertion site. Make sure you: ? Wash your hands with soap and water before you change your bandage (dressing). If soap and water are not available, use hand sanitizer. ? Change your dressing as told by your health care provider. ? Leave stitches (sutures), skin glue, or adhesive strips in place. These skin closures may need to stay in place for 2 weeks or longer. If adhesive strip edges start to loosen and curl up, you may trim the loose edges. Do not remove adhesive strips completely unless your health care provider tells you to do that.  Do not take baths, swim, or use a hot tub until your health care provider approves.  You may shower 24-48 hours after the procedure or as told by your health care provider. ? Gently wash the site with plain soap and water. ? Pat the area dry with a clean towel. ? Do not rub the site. This may cause bleeding.  Do not apply powder or lotion to the site. Keep the site clean and dry.  Check your femoral site every day for signs of infection. Check for: ? Redness, swelling, or pain. ? Fluid or blood. ? Warmth. ? Pus or a bad smell. Activity  For the first 2-3 days after your procedure, or as long as directed: ? Avoid climbing stairs as much as possible. ? Do not squat.  Do not lift anything that is heavier than 10 lb (4.5 kg), or the limit that you are told, until your health care provider says that it is safe.  Rest as  directed. ? Avoid sitting for a long time without moving. Get up to take short walks every 1-2 hours.  Do not drive for 24 hours if you were given a medicine to help you relax (sedative). General instructions  Take over-the-counter and prescription medicines only as told by your health care provider.  Keep all follow-up visits as told by your health care provider. This is important. Contact a health care provider if you have:  A fever or chills.  You have redness, swelling, or pain around your insertion site. Get help right away if:  The catheter insertion area swells very fast.  You pass out.  You suddenly start to sweat or your skin gets clammy.  The catheter insertion area is bleeding, and the bleeding does not stop when you hold steady pressure on the area.  The area near or just beyond the catheter insertion site becomes pale, cool, tingly, or numb. These symptoms may represent a serious problem that is an emergency. Do not wait to see if the symptoms will go away. Get medical help right away. Call your local emergency services (911 in the [place] Do not drive yourself to the hospital. Summary  After the procedure, it is common to have bruising that usually fades within 1-2 weeks.  Check your femoral site every day for signs of infection.  Do not lift anything that is heavier than 10 lb (4.5 kg), or   the limit that you are told, until your health care provider says that it is safe. This information is not intended to replace advice given to you by your health care provider. Make sure you discuss any questions you have with your health care provider. Document Revised: 07/06/2020 Document Reviewed: 07/06/2020 [employer] Patient Education  2021 [employer]  

## 2021-02-27 NOTE — Sedation Documentation (Signed)
Procedure started. Anesthesia case 

## 2021-02-27 NOTE — Transfer of Care (Signed)
Immediate Anesthesia Transfer of Care Note  Patient: [name]  Procedure(s) Performed: STENT PLACEMENT (N/A )  Patient Location: PACU  Anesthesia Type:General  Level of Consciousness: awake, alert  and oriented  Airway & Oxygen Therapy: Patient Spontanous Breathing  Post-op Assessment: Report given to RN and Post -op Vital signs reviewed and stable  Post vital signs: Reviewed and stable  Last Vitals:  Vitals Value Taken Time  BP 112/75 02/27/21 1015  Temp    Pulse 79 02/27/21 1018  Resp 12 02/27/21 1018  SpO2 97 % 02/27/21 1018  Vitals shown include unvalidated device data.  Last Pain:  Vitals:   02/27/21 0720  TempSrc:   PainSc: 1       Patients Stated Pain Goal: 2 (02/27/21 0720)  Complications: No complications documented.

## 2021-02-27 NOTE — Progress Notes (Signed)
Client up and walking. Tolerated well. Right groin site stable.

## 2021-02-27 NOTE — Anesthesia Preprocedure Evaluation (Signed)
Anesthesia Evaluation  Patient identified by MRN, date of birth, ID band Patient awake    Reviewed: Allergy & Precautions, NPO status , Patient's Chart, lab work & pertinent test results  Airway Mallampati: II  TM Distance: >3 FB     Dental   Pulmonary COPD, Current Smoker,    breath sounds clear to auscultation       Cardiovascular hypertension, + CAD and + Past MI   Rhythm:Regular Rate:Normal     Neuro/Psych    GI/Hepatic negative GI ROS, Neg liver ROS,   Endo/Other  diabetes  Renal/GU      Musculoskeletal   Abdominal   Peds  Hematology   Anesthesia Other Findings   Reproductive/Obstetrics                             Anesthesia Physical Anesthesia Plan  ASA: III  Anesthesia Plan: General   Post-op Pain Management:    Induction: Intravenous  PONV Risk Score and Plan: 3 and Ondansetron, Dexamethasone and Midazolam  Airway Management Planned: Oral ETT  Additional Equipment:   Intra-op Plan:   Post-operative Plan: Extubation in OR and Possible Post-op intubation/ventilation  Informed Consent: I have reviewed the patients History and Physical, chart, labs and discussed the procedure including the risks, benefits and alternatives for the proposed anesthesia with the patient or authorized representative who has indicated his/her understanding and acceptance.       Plan Discussed with: Anesthesiologist and CRNA  Anesthesia Plan Comments:         Anesthesia Quick Evaluation

## 2021-02-27 NOTE — Anesthesia Procedure Notes (Signed)
Procedure Name: Intubation Date/Time: 02/27/2021 8:52 AM Performed by: Griffin Dakin, CRNA Pre-anesthesia Checklist: Patient identified, Emergency Drugs available, Suction available and Patient being monitored Patient Re-evaluated:Patient Re-evaluated prior to induction Oxygen Delivery Method: Circle system utilized Preoxygenation: Pre-oxygenation with 100% oxygen Induction Type: IV induction Ventilation: Two handed mask ventilation required and Oral airway inserted - appropriate to patient size Laryngoscope Size: Mac and 4 Grade View: Grade II Tube type: Oral Tube size: 7.5 mm Number of attempts: 1 Airway Equipment and Method: Stylet and Oral airway Placement Confirmation: ETT inserted through vocal cords under direct vision,  positive ETCO2 and breath sounds checked- equal and bilateral Secured at: 23 cm Tube secured with: Tape Dental Injury: Teeth and Oropharynx as per pre-operative assessment

## 2021-02-28 ENCOUNTER — Encounter (HOSPITAL_COMMUNITY): Payer: Self-pay | Admitting: Interventional Radiology

## 2021-02-28 DIAGNOSIS — I6522 Occlusion and stenosis of left carotid artery: Secondary | ICD-10-CM | POA: Diagnosis present

## 2021-03-01 ENCOUNTER — Other Ambulatory Visit: Payer: Self-pay | Admitting: Interventional Radiology

## 2021-03-01 DIAGNOSIS — I659 Occlusion and stenosis of unspecified precerebral artery: Secondary | ICD-10-CM

## 2021-03-04 ENCOUNTER — Encounter (HOSPITAL_COMMUNITY): Payer: Self-pay | Admitting: Radiology

## 2021-03-11 ENCOUNTER — Ambulatory Visit
Admission: RE | Admit: 2021-03-11 | Discharge: 2021-03-11 | Disposition: A | Discharge status: Home / Self Care | Payer: BC Managed Care – PPO | Source: Ambulatory Visit | Attending: Interventional Radiology | Admitting: Interventional Radiology

## 2021-03-11 ENCOUNTER — Other Ambulatory Visit: Payer: Self-pay

## 2021-03-11 ENCOUNTER — Encounter: Payer: Self-pay | Admitting: *Deleted

## 2021-03-11 DIAGNOSIS — I659 Occlusion and stenosis of unspecified precerebral artery: Secondary | ICD-10-CM

## 2021-03-11 HISTORY — PX: IR RADIOLOGIST EVAL & MGMT: IMG5224

## 2021-03-11 NOTE — Progress Notes (Signed)
Chief Complaint: Follow up cervical and cerebral angiogram for symptomatic left ICA stenosis  Referring Physician(s): Dr. Lorie Phenix, [facility] [address] [place], [place] [address]  History of Present Illness: Jeffrey Matthews is a 70 y.o. male presenting as a scheduled follow up to VIR clinic today, having had recent 4-vessel cervical/cerebral angiogram with intention to treat a high-grade symptomatic left ICA stenosis.    Jeffrey Matthews joins [place] today by telemedicine visit with his wife on the call.  We confirmed his identity with 2 personal identifiers.   The angiogram was performed 02/27/2021, via right CFA access.   The angiogram revealed a long-segment narrowing of the proximal cervical ICA, with a questionable patent channel, potentially flowing via irregular vasa vasorum collaterals.  After review of the angiogram and consideration of his lack of interval symptoms, we elected not to proceed with stenting.   Today, Jeffrey Matthews confirms that he has healed just fine from the access point at the right CFA, and that he has not had any interval symptoms.  He denies any recurrent word-finding difficulty/aphasia, and confirms that he has not had any interval vision loss.  He does report that he occasionally "sees stars" and also that he has occasional head aches.      Past Medical History:  Diagnosis Date  . Arthritis   . Asthma   . Complication of anesthesia    slow to wake up  . COPD (chronic obstructive pulmonary disease) (HCC)   . Coronary artery disease   . Diabetes (HCC)   . Fatty liver   . Headache   . Hyperlipidemia   . Hypertension   . Myocardial infarction (HCC)   . Restless legs   . Stroke [facility])     Past Surgical History:  Procedure Laterality Date  . COLONOSCOPY     age 21 [facility]  . CORONARY ARTERY BYPASS GRAFT  2018  . heart bypass    . IR ANGIO INTRA EXTRACRAN SEL COM CAROTID INNOMINATE BILAT MOD SED  02/27/2021  . IR ANGIO VERTEBRAL  SEL SUBCLAVIAN INNOMINATE BILAT MOD SED  02/27/2021  . IR RADIOLOGIST EVAL & MGMT  02/13/2021  . IR US GUIDE VASC ACCESS RIGHT  02/27/2021  . KNEE ARTHROSCOPY Left   . RADIOLOGY WITH ANESTHESIA N/A 02/27/2021   Procedure: STENT PLACEMENT;  Surgeon: Gilmer Mor, DO;  Location: [facility] OR;  Service: Anesthesiology;  Laterality: N/A;    Allergies: Patient has no known allergies.  Medications: Prior to Admission medications   Medication Sig Start Date End Date Taking? Authorizing Provider  albuterol (VENTOLIN HFA) 108 (90 Base) MCG/ACT inhaler Inhale 2 puffs into the lungs every 6 (six) hours as needed for wheezing or shortness of breath.    [provider]  aspirin EC 81 MG tablet Take 81 mg by mouth every morning. 01/10/17   [provider]  budesonide-formoterol (SYMBICORT) 160-4.5 MCG/ACT inhaler Inhale 2 puffs into the lungs daily as needed (asthma).    [provider]  clopidogrel (PLAVIX) 75 MG tablet Take 75 mg by mouth daily.    [provider]  cyclobenzaprine (FLEXERIL) 10 MG tablet Take 1 tablet by mouth at bedtime. 12/01/20   [provider]  empagliflozin (JARDIANCE) 25 MG TABS tablet Take 25 mg by mouth daily. 05/01/17   [provider]  hydrochlorothiazide (HYDRODIURIL) 25 MG tablet Take 25 mg by mouth daily.    [provider]  losartan (COZAAR) 25 MG tablet Take 25 mg  by mouth daily. 12/24/20   [provider]  metFORMIN (GLUCOPHAGE) 500 MG tablet Take 500 mg by mouth daily with breakfast. 02/04/21   [provider]  metoprolol tartrate (LOPRESSOR) 25 MG tablet Take 25 mg by mouth 2 (two) times daily. 01/13/17   [provider]  OZEMPIC, 1 MG/DOSE, 4 MG/3ML SOPN Inject 1 mg into the muscle every Monday. 12/31/20   [provider]  pramipexole (MIRAPEX) 0.75 MG tablet Take 0.75 mg by mouth at bedtime.    [provider]  sildenafil (VIAGRA) 50 MG tablet Take 50 mg by mouth as needed for  erectile dysfunction. 04/07/20   [provider]  simvastatin (ZOCOR) 80 MG tablet Take 80 mg by mouth at bedtime. 04/24/20   [provider]  tamsulosin (FLOMAX) 0.4 MG CAPS capsule Take 0.4 mg by mouth at bedtime.    [provider]  testosterone cypionate (DEPOTESTOSTERONE CYPIONATE) 200 MG/ML injection Inject 100 mg into the muscle every 14 (fourteen) days.    [provider]     Family History  Problem Relation Age of Onset  . Colon cancer Neg Hx   . Esophageal cancer Neg Hx     Social History   Socioeconomic History  . Marital status: Married    Spouse name: Not on file  . Number of children: Not on file  . Years of education: Not on file  . Highest education level: Not on file  Occupational History  . Not on file  Tobacco Use  . Smoking status: Current Every Day Smoker    Packs/day: 0.50    Types: Cigarettes  . Smokeless tobacco: Never Used  Vaping Use  . Vaping Use: Never used  Substance and Sexual Activity  . Alcohol use: Yes    Comment: occasional  . Drug use: Not Currently  . Sexual activity: Not on file  Other Topics Concern  . Not on file  Social History Narrative  . Not on file   Social Determinants of Health   Financial Resource Strain: Not on file  Food Insecurity: Not on file  Transportation Needs: Not on file  Physical Activity: Not on file  Stress: Not on file  Social Connections: Not on file      Review of Systems: A 12 point ROS discussed and pertinent positives are indicated in the HPI above.  All other systems are negative.  Review of Systems  Vital Signs: There were no vitals taken for this visit.  Physical Exam  Mallampati Score:     Imaging: IR US Guide Vasc Access Right  Result Date: 02/27/2021 INDICATION: 69 year old male with a history of symptomatic left ICA, high-grade stenosis with increased surgical risk presenting for angiogram and possible treatment EXAM: ULTRASOUND-GUIDED ACCESS  RIGHT COMMON FEMORAL ARTERY CERVICAL AND CEREBRAL ANGIOGRAM ANGIO-SEAL DEPLOYED FOR HEMOSTASIS. MEDICATIONS: Loading dose 650 mg aspirin, 300 mg Plavix ANESTHESIA/SEDATION: The anesthesia team was present to provide general endotracheal tube anesthesia and for patient monitoring during the procedure. Intubation was performed in negative pressure [place] in neuro IR holding. Interventional neuro radiology nursing staff was also present. FLUOROSCOPY TIME:  Fluoroscopy Time: 6 minutes 36 seconds (647.1 mGy). COMPLICATIONS: None TECHNIQUE: Informed written consent was obtained from the patient after a thorough discussion of the procedural risks, benefits and alternatives. All questions were addressed. Maximal Sterile Barrier Technique was utilized including caps, mask, sterile gowns, sterile gloves, sterile drape, hand hygiene and skin antiseptic. A timeout was performed prior to the initiation of the procedure. PROCEDURE:  Informed written consent was obtained from the patient and the patient's family after a thorough discussion of the procedural risks, benefits and alternatives. Specific risks discussed include: Bleeding, infection, contrast reaction, kidney injury/failure, need for further procedure/surgery, arterial injury or dissection, stroke <6%, intracranial hemorrhage, neurologic deterioration, cardiopulmonary collapse, death. All questions were addressed. Maximal Sterile Barrier Technique was utilized including during the procedure including caps, mask, sterile gowns, sterile gloves, sterile drape, hand hygiene and skin antiseptic. A timeout was performed prior to the initiation of the procedure. Ultrasound survey of the right inguinal region was performed with images stored and sent to PACs. 11 blade scalpel was used to make a small incision. Blunt dissection was performed. A micropuncture needle was used access the right common femoral artery under ultrasound. With excellent arterial blood flow returned, an .018  micro wire was passed through the needle, observed to enter the abdominal aorta under fluoroscopy. The needle was removed, and a micropuncture sheath was placed over the wire. The inner dilator and wire were removed, and an 035 Bentson wire was advanced under fluoroscopy into the abdominal aorta. The sheath was removed and a standard 5 [place] vascular sheath was placed. The dilator was removed and the sheath was flushed. A 48F JB-1 diagnostic catheter was advanced over the wire to the proximal descending thoracic aorta. Wire was then removed. Double flush of the catheter was performed. We then proceeded with 4 vessel angiogram. Catheter was then used to select the right common carotid artery. Angiogram was performed. Catheter was withdrawn. Standard Glidewire was used to navigate the catheter into the right subclavian artery. Catheter was then advanced to the origin of the vertebral artery. Wire was removed and the catheter was aspirated. Angiogram was performed. Catheter was then withdrawn to the aortic arch. Double flush was performed. Catheter was used to select the left common carotid artery. Glidewire was then manipulated into the common carotid artery and the catheter was advanced. Angiogram was performed. Catheter was then withdrawn into the aortic arch with double flush performed. We then selected the left subclavian artery. Catheter was advanced to the origin of the left vertebral artery. Gentle contrast injection confirmed location at the origin of the left vertebral artery. Angiogram was performed. Catheter was withdrawn to the aortic arch. Double flush was performed. After review of the images and measurements were performed, we then advanced the catheter back into the right common carotid artery for a final angiogram 2 assess cross filling from right to left. After this angiogram was performed the catheter was removed. Chlorhexidine was used to clean the skin at the sheath site. 035 diagnostic wire was  advanced into the abdominal aorta. Eight [place] dilator was passed through the right common femoral artery access site. The 5 French sheath was removed on the 035 wire and a 6 French Angio-Seal device was deployed for hemostasis. Patient was then extubated with neurologic exam performed. Patient tolerated the procedure well and remained hemodynamically stable throughout. No complications were encountered and no significant blood loss encountered. FINDINGS: Right findings: Normal course caliber and contour of the innominate artery. No significant calcified plaque. Right common carotid artery:  Normal course caliber and contour. Right external carotid artery: Patent with antegrade flow. Right internal carotid artery: Normal course caliber and contour of the cervical portion. Trace plaque at the right carotid bifurcation, with no calcified plaque. No significant stenosis. The distal cervical segment estimated 5 mm-6 mm diameter at the skull base. Vertical and petrous segment patent with normal course caliber  contour. Cavernous segment patent. Clinoid segment patent. Antegrade flow of the ophthalmic artery. Ophthalmic segment patent. Terminus patent. Right MCA: M1 segment patent, with minimal atherosclerotic changes of the M1 segment and no high-grade stenosis. Insular and opercular segments patent. Unremarkable caliber and course of the cortical segments. Typical arterial, capillary/ parenchymal, and venous phase. Right ACA: The right A1 segment is of robust caliber, with excellent cross-filling through a patent anterior communicating artery. The right-sided carotid flow perfuses the right MCA territory, ACA territory, as well as the left ACA territory and left MCA territory. Temporally, there is essentially symmetric filling of the watershed region of the bilateral hemispheres, with no delay in the early arterial and late arterial phase is of the left and right hemisphere. Right vertebral artery: Mildly tortuous  origin of the right vertebral artery from the subclavian artery. No significant plaque at the origin of the vertebral artery. Unremarkable course caliber and contour of the cervical segment. C1 and C2 arteries opacify. The right vertebral artery ends in PICA. Left findings: Left common carotid artery: Normal course caliber and contour. No significant atherosclerotic changes at the origin. Left external carotid artery: Patent with antegrade robust flow. Left internal carotid artery: Long segment irregular narrowing at the origin of the left ICA, estimated at least 3 cm in length. At the most narrowed segment, it is difficult to perceive a patent channel, potentially with vaso vasorum reconstituting the distal channel. The estimated distal diameter at the skull base 3.5 cm. The site of greatest narrowing at the proximal ICA estimated 0.58 mm. By NASCET criteria, this estimates 83-84% stenosis, although this presumes that there is an actual patent channel. The length of the narrowing measures at least 3 cm, which would likely require overlapping stent construction. Left MCA: Terminus patent. Slip streaming artifact with loss of opacification present beyond the terminus, given the significant cross flow from a patent left-sided A2 artery. Insular and opercular segments patent. Left ACA:  Patent left-sided A2 artery. Robust superficial temporal artery, with filling through the frontal branch and supraorbital branch contributing to retrograde opacification of the ophthalmic artery, the ciliary blush on the lateral view is visualized secondary to the retrograde opacification. IMPRESSION: Status post ultrasound guided access right common femoral artery for cervical and cerebral angiogram, with intention to treat high-grade symptomatic left ICA lesion. Angiogram identified long-segment proximal ICA irregularity secondary to soft plaque, with questionable patent channel. Given possible risk of distal embolization before  distal protection and the robust right to left cross-filling, stenting was not pursued at this time. Angio-Seal for hemostasis. Signed, Yvone NeuJaime S. Reyne DumasWagner, DO, RPVI [facility] Electronically Signed   By: Gilmer MorJaime  Demani Mcbrien D.O.   On: 02/27/2021 13:14   IR Radiologist Eval & Mgmt  Result Date: 02/13/2021 Please refer to notes tab for details about interventional procedure. (Op Note)  IR ANGIO INTRA EXTRACRAN SEL COM CAROTID INNOMINATE BILAT MOD SED  Result Date: 02/27/2021 INDICATION: 70 year old male with a history of symptomatic left ICA, high-grade stenosis with increased surgical risk presenting for angiogram and possible treatment EXAM: ULTRASOUND-GUIDED ACCESS RIGHT COMMON FEMORAL ARTERY CERVICAL AND CEREBRAL ANGIOGRAM ANGIO-SEAL DEPLOYED FOR HEMOSTASIS. MEDICATIONS: Loading dose 650 mg aspirin, 300 mg Plavix ANESTHESIA/SEDATION: The anesthesia team was present to provide general endotracheal tube anesthesia and for patient monitoring during the procedure. Intubation was performed in negative pressure [place] in neuro IR holding. Interventional neuro radiology nursing staff was also present. FLUOROSCOPY TIME:  Fluoroscopy Time: 6 minutes 36 seconds (647.1 mGy).  COMPLICATIONS: None TECHNIQUE: Informed written consent was obtained from the patient after a thorough discussion of the procedural risks, benefits and alternatives. All questions were addressed. Maximal Sterile Barrier Technique was utilized including caps, mask, sterile gowns, sterile gloves, sterile drape, hand hygiene and skin antiseptic. A timeout was performed prior to the initiation of the procedure. PROCEDURE: Informed written consent was obtained from the patient and the patient's family after a thorough discussion of the procedural risks, benefits and alternatives. Specific risks discussed include: Bleeding, infection, contrast reaction, kidney injury/failure, need for further  procedure/surgery, arterial injury or dissection, stroke <6%, intracranial hemorrhage, neurologic deterioration, cardiopulmonary collapse, death. All questions were addressed. Maximal Sterile Barrier Technique was utilized including during the procedure including caps, mask, sterile gowns, sterile gloves, sterile drape, hand hygiene and skin antiseptic. A timeout was performed prior to the initiation of the procedure. Ultrasound survey of the right inguinal region was performed with images stored and sent to PACs. 11 blade scalpel was used to make a small incision. Blunt dissection was performed. A micropuncture needle was used access the right common femoral artery under ultrasound. With excellent arterial blood flow returned, an .018 micro wire was passed through the needle, observed to enter the abdominal aorta under fluoroscopy. The needle was removed, and a micropuncture sheath was placed over the wire. The inner dilator and wire were removed, and an 035 Bentson wire was advanced under fluoroscopy into the abdominal aorta. The sheath was removed and a standard 5 [place] vascular sheath was placed. The dilator was removed and the sheath was flushed. A 53F JB-1 diagnostic catheter was advanced over the wire to the proximal descending thoracic aorta. Wire was then removed. Double flush of the catheter was performed. We then proceeded with 4 vessel angiogram. Catheter was then used to select the right common carotid artery. Angiogram was performed. Catheter was withdrawn. Standard Glidewire was used to navigate the catheter into the right subclavian artery. Catheter was then advanced to the origin of the vertebral artery. Wire was removed and the catheter was aspirated. Angiogram was performed. Catheter was then withdrawn to the aortic arch. Double flush was performed. Catheter was used to select the left common carotid artery. Glidewire was then manipulated into the common carotid artery and the catheter was  advanced. Angiogram was performed. Catheter was then withdrawn into the aortic arch with double flush performed. We then selected the left subclavian artery. Catheter was advanced to the origin of the left vertebral artery. Gentle contrast injection confirmed location at the origin of the left vertebral artery. Angiogram was performed. Catheter was withdrawn to the aortic arch. Double flush was performed. After review of the images and measurements were performed, we then advanced the catheter back into the right common carotid artery for a final angiogram 2 assess cross filling from right to left. After this angiogram was performed the catheter was removed. Chlorhexidine was used to clean the skin at the sheath site. 035 diagnostic wire was advanced into the abdominal aorta. Eight [place] dilator was passed through the right common femoral artery access site. The 5 French sheath was removed on the 035 wire and a 6 French Angio-Seal device was deployed for hemostasis. Patient was then extubated with neurologic exam performed. Patient tolerated the procedure well and remained hemodynamically stable throughout. No complications were encountered and no significant blood loss encountered. FINDINGS: Right findings: Normal course caliber and contour of the innominate artery. No significant calcified plaque. Right common carotid artery:  Normal course caliber and  contour. Right external carotid artery: Patent with antegrade flow. Right internal carotid artery: Normal course caliber and contour of the cervical portion. Trace plaque at the right carotid bifurcation, with no calcified plaque. No significant stenosis. The distal cervical segment estimated 5 mm-6 mm diameter at the skull base. Vertical and petrous segment patent with normal course caliber contour. Cavernous segment patent. Clinoid segment patent. Antegrade flow of the ophthalmic artery. Ophthalmic segment patent. Terminus patent. Right MCA: M1 segment patent,  with minimal atherosclerotic changes of the M1 segment and no high-grade stenosis. Insular and opercular segments patent. Unremarkable caliber and course of the cortical segments. Typical arterial, capillary/ parenchymal, and venous phase. Right ACA: The right A1 segment is of robust caliber, with excellent cross-filling through a patent anterior communicating artery. The right-sided carotid flow perfuses the right MCA territory, ACA territory, as well as the left ACA territory and left MCA territory. Temporally, there is essentially symmetric filling of the watershed region of the bilateral hemispheres, with no delay in the early arterial and late arterial phase is of the left and right hemisphere. Right vertebral artery: Mildly tortuous origin of the right vertebral artery from the subclavian artery. No significant plaque at the origin of the vertebral artery. Unremarkable course caliber and contour of the cervical segment. C1 and C2 arteries opacify. The right vertebral artery ends in PICA. Left findings: Left common carotid artery: Normal course caliber and contour. No significant atherosclerotic changes at the origin. Left external carotid artery: Patent with antegrade robust flow. Left internal carotid artery: Long segment irregular narrowing at the origin of the left ICA, estimated at least 3 cm in length. At the most narrowed segment, it is difficult to perceive a patent channel, potentially with vaso vasorum reconstituting the distal channel. The estimated distal diameter at the skull base 3.5 cm. The site of greatest narrowing at the proximal ICA estimated 0.58 mm. By NASCET criteria, this estimates 83-84% stenosis, although this presumes that there is an actual patent channel. The length of the narrowing measures at least 3 cm, which would likely require overlapping stent construction. Left MCA: Terminus patent. Slip streaming artifact with loss of opacification present beyond the terminus, given the  significant cross flow from a patent left-sided A2 artery. Insular and opercular segments patent. Left ACA:  Patent left-sided A2 artery. Robust superficial temporal artery, with filling through the frontal branch and supraorbital branch contributing to retrograde opacification of the ophthalmic artery, the ciliary blush on the lateral view is visualized secondary to the retrograde opacification. IMPRESSION: Status post ultrasound guided access right common femoral artery for cervical and cerebral angiogram, with intention to treat high-grade symptomatic left ICA lesion. Angiogram identified long-segment proximal ICA irregularity secondary to soft plaque, with questionable patent channel. Given possible risk of distal embolization before distal protection and the robust right to left cross-filling, stenting was not pursued at this time. Angio-Seal for hemostasis. Signed, Yvone Neu. Reyne Dumas, RPVI [facility] Electronically Signed   By: Gilmer Mor D.O.   On: 02/27/2021 13:14   IR ANGIO VERTEBRAL SEL SUBCLAVIAN INNOMINATE BILAT MOD SED  INDICATION: 70 year old male with a history of symptomatic left ICA, high-grade stenosis with increased surgical risk presenting for angiogram and possible treatment EXAM: ULTRASOUND-GUIDED ACCESS RIGHT COMMON FEMORAL ARTERY CERVICAL AND CEREBRAL ANGIOGRAM ANGIO-SEAL DEPLOYED FOR HEMOSTASIS. MEDICATIONS: Loading dose 650 mg aspirin, 300 mg Plavix ANESTHESIA/SEDATION: The anesthesia team was present to provide general endotracheal tube anesthesia and for patient monitoring during the  procedure. Intubation was performed in negative pressure [place] in neuro IR holding. Interventional neuro radiology nursing staff was also present. FLUOROSCOPY TIME:  Fluoroscopy Time: 6 minutes 36 seconds (647.1 mGy). COMPLICATIONS: None TECHNIQUE: Informed written consent was obtained from the patient after a thorough discussion of the  procedural risks, benefits and alternatives. All questions were addressed. Maximal Sterile Barrier Technique was utilized including caps, mask, sterile gowns, sterile gloves, sterile drape, hand hygiene and skin antiseptic. A timeout was performed prior to the initiation of the procedure. PROCEDURE: Informed written consent was obtained from the patient and the patient's family after a thorough discussion of the procedural risks, benefits and alternatives. Specific risks discussed include: Bleeding, infection, contrast reaction, kidney injury/failure, need for further procedure/surgery, arterial injury or dissection, stroke <6%, intracranial hemorrhage, neurologic deterioration, cardiopulmonary collapse, death. All questions were addressed. Maximal Sterile Barrier Technique was utilized including during the procedure including caps, mask, sterile gowns, sterile gloves, sterile drape, hand hygiene and skin antiseptic. A timeout was performed prior to the initiation of the procedure. Ultrasound survey of the right inguinal region was performed with images stored and sent to PACs. 11 blade scalpel was used to make a small incision. Blunt dissection was performed. A micropuncture needle was used access the right common femoral artery under ultrasound. With excellent arterial blood flow returned, an .018 micro wire was passed through the needle, observed to enter the abdominal aorta under fluoroscopy. The needle was removed, and a micropuncture sheath was placed over the wire. The inner dilator and wire were removed, and an 035 Bentson wire was advanced under fluoroscopy into the abdominal aorta. The sheath was removed and a standard 5 [place] vascular sheath was placed. The dilator was removed and the sheath was flushed. A 8F JB-1 diagnostic catheter was advanced over the wire to the proximal descending thoracic aorta. Wire was then removed. Double flush of the catheter was performed. We then proceeded with 4 vessel  angiogram. Catheter was then used to select the right common carotid artery. Angiogram was performed. Catheter was withdrawn. Standard Glidewire was used to navigate the catheter into the right subclavian artery. Catheter was then advanced to the origin of the vertebral artery. Wire was removed and the catheter was aspirated. Angiogram was performed. Catheter was then withdrawn to the aortic arch. Double flush was performed. Catheter was used to select the left common carotid artery. Glidewire was then manipulated into the common carotid artery and the catheter was advanced. Angiogram was performed. Catheter was then withdrawn into the aortic arch with double flush performed. We then selected the left subclavian artery. Catheter was advanced to the origin of the left vertebral artery. Gentle contrast injection confirmed location at the origin of the left vertebral artery. Angiogram was performed. Catheter was withdrawn to the aortic arch. Double flush was performed. After review of the images and measurements were performed, we then advanced the catheter back into the right common carotid artery for a final angiogram 2 assess cross filling from right to left. After this angiogram was performed the catheter was removed. Chlorhexidine was used to clean the skin at the sheath site. 035 diagnostic wire was advanced into the abdominal aorta. Eight [place] dilator was passed through the right common femoral artery access site. The 5 French sheath was removed on the 035 wire and a 6 French Angio-Seal device was deployed for hemostasis. Patient was then extubated with neurologic exam performed. Patient tolerated the procedure well and remained hemodynamically stable throughout. No complications were encountered  and no significant blood loss encountered. FINDINGS: Right findings: Normal course caliber and contour of the innominate artery. No significant calcified plaque. Right common carotid artery:  Normal course caliber and  contour. Right external carotid artery: Patent with antegrade flow. Right internal carotid artery: Normal course caliber and contour of the cervical portion. Trace plaque at the right carotid bifurcation, with no calcified plaque. No significant stenosis. The distal cervical segment estimated 5 mm-6 mm diameter at the skull base. Vertical and petrous segment patent with normal course caliber contour. Cavernous segment patent. Clinoid segment patent. Antegrade flow of the ophthalmic artery. Ophthalmic segment patent. Terminus patent. Right MCA: M1 segment patent, with minimal atherosclerotic changes of the M1 segment and no high-grade stenosis. Insular and opercular segments patent. Unremarkable caliber and course of the cortical segments. Typical arterial, capillary/ parenchymal, and venous phase. Right ACA: The right A1 segment is of robust caliber, with excellent cross-filling through a patent anterior communicating artery. The right-sided carotid flow perfuses the right MCA territory, ACA territory, as well as the left ACA territory and left MCA territory. Temporally, there is essentially symmetric filling of the watershed region of the bilateral hemispheres, with no delay in the early arterial and late arterial phase is of the left and right hemisphere. Right vertebral artery: Mildly tortuous origin of the right vertebral artery from the subclavian artery. No significant plaque at the origin of the vertebral artery. Unremarkable course caliber and contour of the cervical segment. C1 and C2 arteries opacify. The right vertebral artery ends in PICA. Left findings: Left common carotid artery: Normal course caliber and contour. No significant atherosclerotic changes at the origin. Left external carotid artery: Patent with antegrade robust flow. Left internal carotid artery: Long segment irregular narrowing at the origin of the left ICA, estimated at least 3 cm in length. At the most narrowed segment, it is difficult  to perceive a patent channel, potentially with vaso vasorum reconstituting the distal channel. The estimated distal diameter at the skull base 3.5 cm. The site of greatest narrowing at the proximal ICA estimated 0.58 mm. By NASCET criteria, this estimates 83-84% stenosis, although this presumes that there is an actual patent channel. The length of the narrowing measures at least 3 cm, which would likely require overlapping stent construction. Left MCA: Terminus patent. Slip streaming artifact with loss of opacification present beyond the terminus, given the significant cross flow from a patent left-sided A2 artery. Insular and opercular segments patent. Left ACA:  Patent left-sided A2 artery. Robust superficial temporal artery, with filling through the frontal branch and supraorbital branch contributing to retrograde opacification of the ophthalmic artery, the ciliary blush on the lateral view is visualized secondary to the retrograde opacification. IMPRESSION: Status post ultrasound guided access right common femoral artery for cervical and cerebral angiogram, with intention to treat high-grade symptomatic left ICA lesion. Angiogram identified long-segment proximal ICA irregularity secondary to soft plaque, with questionable patent channel. Given possible risk of distal embolization before distal protection and the robust right to left cross-filling, stenting was not pursued at this time. Angio-Seal for hemostasis. Signed, Yvone Neu. Reyne Dumas, RPVI [facility] Electronically Signed   By: Gilmer Mor D.O.   On: 02/27/2021 13:14    Labs:  CBC: Recent Labs    12/24/20 1302 02/27/21 0712  WBC 10.0 6.0  HGB 18.2* 16.6  HCT 55.0* 48.3  PLT 150 111*    COAGS: Recent Labs    05/11/20 1038 12/24/20 1302 02/27/21  1610  INR 0.9 0.9 1.0  APTT  --  26  --     BMP: Recent Labs    12/24/20 1302 02/27/21 0712  NA 136 135  K 3.8 3.2*  CL  100 101  CO2 25 22  GLUCOSE 174* 138*  BUN 17 18  CALCIUM 8.7* 8.9  CREATININE 1.36* 1.16  GFRNONAA 56* >60    LIVER FUNCTION TESTS: Recent Labs    12/24/20 1302  BILITOT 1.2  AST 29  ALT 52*  ALKPHOS 96  PROT 5.6*  ALBUMIN 3.8    TUMOR MARKERS: No results for input(s): AFPTM, CEA, CA199, CHROMGRNA in the last 8760 hours.  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    Today we discussed the imaging findings of the cerebral angiogram, and the reasons that we elected not to proceed with attempt at stenting -- essentially because we interpreted the angiogram as revealing a possible discontinuous channel, which would elevate the peri-procedural risk of stroke unnecessarily above 6%.  I shared these details with Jeffrey Gadson and his wife.   I did also emphasize that given the index event is still well within 6 months, his risk of recurrent stroke/TIA attributable to the left ICA disease is not yet normalized, and that I feel it is very reasonable to have further discussion with surgery team regarding possible carotid endarterectomy.    He understands, and would like to go for surgical consultation.    I have discussed also with his PCP, Lorie Phenix, of [facility].   Plan: - Continue current medical care, including dual anti-platelet therapy and continued efforts for complete tobacco cessation.  - Refer for surgical consultation, Dr. Randie Heinz of Vein & Vascular - Please sent digital copy of the CT, Jeffrey, and carotid duplex imaging (performed at [facility]) to Vascular Surgery office  - Please CC copy of this report to Dr. Lorie Phenix   Electronically Signed: Gilmer Mor 03/11/2021, 2:35 PM   I spent a total of    25 Minutes in face to face in clinical consultation, greater than 50% of which was counseling/coordinating care for symptomatic left ICA high grade stenosis

## 2021-03-14 ENCOUNTER — Encounter: Payer: BC Managed Care – PPO | Admitting: Vascular Surgery

## 2021-03-14 ENCOUNTER — Other Ambulatory Visit: Payer: Self-pay

## 2021-03-18 ENCOUNTER — Other Ambulatory Visit: Payer: Self-pay

## 2021-03-18 ENCOUNTER — Ambulatory Visit (INDEPENDENT_AMBULATORY_CARE_PROVIDER_SITE_OTHER): Payer: BC Managed Care – PPO | Admitting: Vascular Surgery

## 2021-03-18 ENCOUNTER — Encounter: Payer: Self-pay | Admitting: Vascular Surgery

## 2021-03-18 DIAGNOSIS — I6522 Occlusion and stenosis of left carotid artery: Secondary | ICD-10-CM

## 2021-03-18 NOTE — H&P (View-Only) (Signed)
Virtual Visit via Telephone Note  Referring MD: Dr. Ardelle Anton  I connected with [name] on 03/18/2021 using the [employer] by telephone and verified that I was speaking with the correct person using two identifiers. Patient was located at home with his wife and I am in the hospital alone.    The limitations of evaluation and management by telemedicine and the availability of in person appointments have been previously discussed with the patient and are documented in the patients chart. The patient expressed understanding and consented to proceed.  PCP: Selinda Flavin, MD   Chief Complaint: Symptomatic left internal carotid artery stenosis  History of Present Illness: Jeffrey Matthews is a 70 y.o. male with history of speech deficit lasted less than 5 minutes. He could not get his words out. Occurred about 2 months ago. He is right hand dominant. Also more recently have left eye vision change with an ill inability to grasp objects.  In March of this year he underwent work-up with MRI that was negative and duplex which demonstrated 70-99% stenosis on the left.  He has been asymptomatic from a right eye standpoint.  He has previous coronary artery disease with coronary artery bypass grafting performed at [facility].  He is currently on aspirin, Plavix and Zocor.  Past Medical History:  Diagnosis Date  . Arthritis   . Asthma   . Complication of anesthesia    slow to wake up  . COPD (chronic obstructive pulmonary disease) (HCC)   . Coronary artery disease   . Diabetes (HCC)   . Fatty liver   . Headache   . Hyperlipidemia   . Hypertension   . Myocardial infarction (HCC)   . Restless legs   . Stroke [facility])     Past Surgical History:  Procedure Laterality Date  . COLONOSCOPY     age 14 [facility]  . CORONARY ARTERY BYPASS GRAFT  2018  . heart bypass    . IR ANGIO INTRA EXTRACRAN SEL COM CAROTID INNOMINATE BILAT MOD SED  02/27/2021  . IR ANGIO  VERTEBRAL SEL SUBCLAVIAN INNOMINATE BILAT MOD SED  02/27/2021  . IR RADIOLOGIST EVAL & MGMT  02/13/2021  . IR RADIOLOGIST EVAL & MGMT  03/11/2021  . IR US GUIDE VASC ACCESS RIGHT  02/27/2021  . KNEE ARTHROSCOPY Left   . RADIOLOGY WITH ANESTHESIA N/A 02/27/2021   Procedure: STENT PLACEMENT;  Surgeon: Gilmer Mor, DO;  [facility] OR;  Service: Anesthesiology;  Laterality: N/A;    No outpatient medications have been marked as taking for the 03/18/21 encounter (Appointment) with Maeola Harman, MD.    12 system ROS was negative unless otherwise noted in HPI   Observations/Objective: There were no vitals filed for this visit. Patient is awake and alert and understanding of our conversation   MRI Brain IMPRESSION: 1. No acute intracranial finding. 2. Diminutive left cervical and cavernous ICA, recommend ultrasound or CTA to evaluate for flow reducing stenosis in the neck. 3. Ethmoid and left maxillary sinus inflammation. 4. Mild chronic small vessel ischemia in the cerebral white matter. Small remote cerebellar infarcts.   Carotid Duplex IMPRESSION: Right:  Color duplex indicates minimal heterogeneous plaque, with no hemodynamically significant stenosis by duplex criteria in the extracranial cerebrovascular circulation.  Left: Heterogeneous plaque at the left carotid bifurcation, with discordant results regarding degree of stenosis by established duplex criteria. Peak velocity and the waveform suggests 70% - 99% stenosis, with the ICA/ CCA ratio suggesting  a lesser degree of stenosis. If establishing a more accurate degree of stenosis is required, cerebral angiogram should be considered, or as a second best test, CTA.   Assessment and Plan: 69yo with history of symptomatic left ica stenosis.  I discussed with him the risk and benefits as well as alternatives of carotid endarterectomy.  At this point the alternative would be medical therapy only which he has been  placed on aspirin, Plavix and a statin.  We will continue all of these medications throughout the perioperative time.  We will get him scheduled in the next week or 2.  We will attempt to get cardiac clearance but given that he is symptomatic we will need to proceed with or without.  At this time he does not have any chest pain to suggest he has ongoing coronary ischemia.  Follow Up Instructions:    I discussed the assessment and treatment plan with the patient. The patient was provided an opportunity to ask questions and all were answered. The patient agreed with the plan and demonstrated an understanding of the instructions.   The patient was advised to call back or seek an in-person evaluation if the symptoms worsen or if the condition fails to improve as anticipated.  I spent 22 minutes with the patient and reviewing documentation via telephone encounter.   Signed, Lemar Livings [facility] [place] Office: [phone]  03/18/2021, 12:49 PM

## 2021-03-18 NOTE — Progress Notes (Signed)
    Virtual Visit via Telephone Note  Referring MD: Dr. Wagoner  I connected with Jeffrey Matthews on 03/18/2021 using the [employer]me by telephone and verified that I was speaking with the correct person using two identifiers. Patient was located at home with his wife and I am in the hospital alone.    The limitations of evaluation and management by telemedicine and the availability of in person appointments have been previously discussed with the patient and are documented in the patients chart. The patient expressed understanding and consented to proceed.  PCP: Howard, Kevin, MD   Chief Complaint: Symptomatic left internal carotid artery stenosis  History of Present Illness: Jeffrey Matthews is a 69 y.o. male with history of speech deficit lasted less than 5 minutes. He could not get his words out. Occurred about 2 months ago. He is right hand dominant. Also more recently have left eye vision change with an ill inability to grasp objects.  In March of this year he underwent work-up with MRI that was negative and duplex which demonstrated 70-99% stenosis on the left.  He has been asymptomatic from a right eye standpoint.  He has previous coronary artery disease with coronary artery bypass grafting performed at [facility].  He is currently on aspirin, Plavix and Zocor.  Past Medical History:  Diagnosis Date  . Arthritis   . Asthma   . Complication of anesthesia    slow to wake up  . COPD (chronic obstructive pulmonary disease) (HCC)   . Coronary artery disease   . Diabetes (HCC)   . Fatty liver   . Headache   . Hyperlipidemia   . Hypertension   . Myocardial infarction (HCC)   . Restless legs   . Stroke (HCC)     Past Surgical History:  Procedure Laterality Date  . COLONOSCOPY     age 50 [facility]  . CORONARY ARTERY BYPASS GRAFT  2018  . heart bypass    . IR ANGIO INTRA EXTRACRAN SEL COM CAROTID INNOMINATE BILAT MOD SED  02/27/2021  . IR ANGIO  VERTEBRAL SEL SUBCLAVIAN INNOMINATE BILAT MOD SED  02/27/2021  . IR RADIOLOGIST EVAL & MGMT  02/13/2021  . IR RADIOLOGIST EVAL & MGMT  03/11/2021  . IR US GUIDE VASC ACCESS RIGHT  02/27/2021  . KNEE ARTHROSCOPY Left   . RADIOLOGY WITH ANESTHESIA N/A 02/27/2021   Procedure: STENT PLACEMENT;  Surgeon: Wagner, Jaime, DO;  Location: [facility] OR;  Service: Anesthesiology;  Laterality: N/A;    No outpatient medications have been marked as taking for the 03/18/21 encounter (Appointment) with Kellina Dreese Christopher, MD.    12 system ROS was negative unless otherwise noted in HPI   Observations/Objective: There were no vitals filed for this visit. Patient is awake and alert and understanding of our conversation   MRI Brain IMPRESSION: 1. No acute intracranial finding. 2. Diminutive left cervical and cavernous ICA, recommend ultrasound or CTA to evaluate for flow reducing stenosis in the neck. 3. Ethmoid and left maxillary sinus inflammation. 4. Mild chronic small vessel ischemia in the cerebral white matter. Small remote cerebellar infarcts.   Carotid Duplex IMPRESSION: Right:  Color duplex indicates minimal heterogeneous plaque, with no hemodynamically significant stenosis by duplex criteria in the extracranial cerebrovascular circulation.  Left: Heterogeneous plaque at the left carotid bifurcation, with discordant results regarding degree of stenosis by established duplex criteria. Peak velocity and the waveform suggests 70% - 99% stenosis, with the ICA/ CCA ratio suggesting   a lesser degree of stenosis. If establishing a more accurate degree of stenosis is required, cerebral angiogram should be considered, or as a second best test, CTA.   Assessment and Plan: 69yo with history of symptomatic left ica stenosis.  I discussed with him the risk and benefits as well as alternatives of carotid endarterectomy.  At this point the alternative would be medical therapy only which he has been  placed on aspirin, Plavix and a statin.  We will continue all of these medications throughout the perioperative time.  We will get him scheduled in the next week or 2.  We will attempt to get cardiac clearance but given that he is symptomatic we will need to proceed with or without.  At this time he does not have any chest pain to suggest he has ongoing coronary ischemia.  Follow Up Instructions:    I discussed the assessment and treatment plan with the patient. The patient was provided an opportunity to ask questions and all were answered. The patient agreed with the plan and demonstrated an understanding of the instructions.   The patient was advised to call back or seek an in-person evaluation if the symptoms worsen or if the condition fails to improve as anticipated.  I spent 22 minutes with the patient and reviewing documentation via telephone encounter.   Signed, Lemar Livings [facility] [place] Office: [phone]  03/18/2021, 12:49 PM

## 2021-03-25 NOTE — Progress Notes (Signed)
Surgical Instructions    Your procedure is scheduled on Thursday, May 12.  Report to [facility] Main Entrance "A" at 5:30 A.M., then check in with the Admitting office.  Call this number if you have problems the morning of surgery:  [phone]   If you have any questions prior to your surgery date call [phone]: Open Monday-Friday 8am-4pm    Remember:  Do not eat or drink after midnight the night before your surgery    Take these medicines the morning of surgery with A SIP OF WATER   As Needed: albuterol (VENTOLIN HFA) budesonide-formoterol (SYMBICORT)   WHAT DO I DO ABOUT MY DIABETES MEDICATION?   DO NOT TAKE empagliflozin (JARDIANCE) on Wednesday, May 11 and Thursday, May 12  DO NOT TAKE  metFORMIN (GLUCOPHAGE) the morning of surgery.   HOW TO MANAGE YOUR DIABETES BEFORE AND AFTER SURGERY  Why is it important to control my blood sugar before and after surgery? . Improving blood sugar levels before and after surgery helps healing and can limit problems. . A way of improving blood sugar control is eating a healthy diet by: o  Eating less sugar and carbohydrates o  Increasing activity/exercise o  Talking with your doctor about reaching your blood sugar goals . High blood sugars (greater than 180 mg/dL) can raise your risk of infections and slow your recovery, so you will need to focus on controlling your diabetes during the weeks before surgery. . Make sure that the doctor who takes care of your diabetes knows about your planned surgery including the date and location.  How do I manage my blood sugar before surgery? . Check your blood sugar at least 4 times a day, starting 2 days before surgery, to make sure that the level is not too high or low. . Check your blood sugar the morning of your surgery when you wake up and every 2 hours until you get to the Short Stay unit. o If your blood sugar is less than 70 mg/dL, you will need to treat for low blood sugar: - Do not  take insulin. - Treat a low blood sugar (less than 70 mg/dL) with  cup of clear juice (cranberry or apple), 4 glucose tablets, OR glucose gel. - Recheck blood sugar in 15 minutes after treatment (to make sure it is greater than 70 mg/dL). If your blood sugar is not greater than 70 mg/dL on recheck, call [phone] for further instructions. . Report your blood sugar to the short stay nurse when you get to Short Stay.  . If you are admitted to the hospital after surgery: o Your blood sugar will be checked by the staff and you will probably be given insulin after surgery (instead of oral diabetes medicines) to make sure you have good blood sugar levels. o The goal for blood sugar control after surgery is 80-180 mg/dL.  Follow your surgeon's instructions on when to stop Aspirin and Plavix.  If no instructions were given by your surgeon then you will need to call the office to get those instructions.   As of today, STOP taking any Aspirin (unless otherwise instructed by your surgeon) Aleve, Naproxen, Ibuprofen, Motrin, Advil, Goody's, BC's, all herbal medications, fish oil, and all vitamins.                     Do not wear jewelry.            Do not wear lotions, powders, colognes, or deodorant.  Do not shave 48 hours prior to surgery.  You may shave face and neck.            Do not bring valuables to the hospital.            [facility] is not responsible for any belongings or valuables.  Do NOT Smoke (Tobacco/Vaping) or drink Alcohol 24 hours prior to your procedure If you use a CPAP at night, you may bring all equipment for your overnight stay.   Contacts, glasses, dentures or bridgework may not be worn into surgery, please bring cases for these belongings   For patients admitted to the hospital, discharge time will be determined by your treatment team.   Patients discharged the day of surgery will not be allowed to drive home, and someone needs to stay with them for 24  hours.    Special instructions:   [place]- Preparing For Surgery  Before surgery, you can play an important role. Because skin is not sterile, your skin needs to be as free of germs as possible. You can reduce the number of germs on your skin by washing with CHG (chlorahexidine gluconate) Soap before surgery.  CHG is an antiseptic cleaner which kills germs and bonds with the skin to continue killing germs even after washing.    Oral Hygiene is also important to reduce your risk of infection.  Remember - BRUSH YOUR TEETH THE MORNING OF SURGERY WITH YOUR REGULAR TOOTHPASTE  Please do not use if you have an allergy to CHG or antibacterial soaps. If your skin becomes reddened/irritated stop using the CHG.  Do not shave (including legs and underarms) for at least 48 hours prior to first CHG shower. It is OK to shave your face.  Please follow these instructions carefully.   1. Shower the NIGHT BEFORE SURGERY and the MORNING OF SURGERY  2. If you chose to wash your hair, wash your hair first as usual with your normal shampoo.  3. After you shampoo, rinse your hair and body thoroughly to remove the shampoo.  4. Wash Face and genitals (private parts) with your normal soap.   5.  Shower the NIGHT BEFORE SURGERY and the MORNING OF SURGERY with CHG Soap.   6. Use CHG Soap as you would any other liquid soap. You can apply CHG directly to the skin and wash gently with a scrungie or a clean washcloth.   7. Apply the CHG Soap to your body ONLY FROM THE NECK DOWN.  Do not use on open wounds or open sores. Avoid contact with your eyes, ears, mouth and genitals (private parts). Wash Face and genitals (private parts)  with your normal soap.   8. Wash thoroughly, paying special attention to the area where your surgery will be performed.  9. Thoroughly rinse your body with warm water from the neck down.  10. DO NOT shower/wash with your normal soap after using and rinsing off the CHG Soap.  11. Pat  yourself dry with a CLEAN TOWEL.  12. Wear CLEAN PAJAMAS to bed the night before surgery  13. Place CLEAN SHEETS on your bed the night before your surgery  14. DO NOT SLEEP WITH PETS.   Day of Surgery: Take a shower with CHG soap. Wear Clean/Comfortable clothing the morning of surgery Do not apply any deodorants/lotions.   Remember to brush your teeth WITH YOUR REGULAR TOOTHPASTE.   Please read over the following fact sheets that you were given.

## 2021-03-26 ENCOUNTER — Encounter (HOSPITAL_COMMUNITY)
Admission: RE | Admit: 2021-03-26 | Discharge: 2021-03-26 | Disposition: A | Discharge status: Home / Self Care | Payer: BC Managed Care – PPO | Source: Ambulatory Visit | Attending: Vascular Surgery | Admitting: Vascular Surgery

## 2021-03-26 ENCOUNTER — Other Ambulatory Visit: Payer: Self-pay

## 2021-03-26 ENCOUNTER — Encounter (HOSPITAL_COMMUNITY): Payer: Self-pay

## 2021-03-26 DIAGNOSIS — Z01812 Encounter for preprocedural laboratory examination: Secondary | ICD-10-CM | POA: Insufficient documentation

## 2021-03-26 DIAGNOSIS — Z951 Presence of aortocoronary bypass graft: Secondary | ICD-10-CM | POA: Insufficient documentation

## 2021-03-26 DIAGNOSIS — I252 Old myocardial infarction: Secondary | ICD-10-CM | POA: Insufficient documentation

## 2021-03-26 DIAGNOSIS — Z20822 Contact with and (suspected) exposure to covid-19: Secondary | ICD-10-CM | POA: Insufficient documentation

## 2021-03-26 HISTORY — DX: Inflammatory liver disease, unspecified: K75.9

## 2021-03-26 LAB — COMPREHENSIVE METABOLIC PANEL
ALT: 29 U/L (ref 0–44)
AST: 32 U/L (ref 15–41)
Albumin: 4.3 g/dL (ref 3.5–5.0)
Alkaline Phosphatase: 93 U/L (ref 38–126)
Anion gap: 8 (ref 5–15)
BUN: 10 mg/dL (ref 8–23)
CO2: 28 mmol/L (ref 22–32)
Calcium: 9.3 mg/dL (ref 8.9–10.3)
Chloride: 105 mmol/L (ref 98–111)
Creatinine, Ser: 0.94 mg/dL (ref 0.61–1.24)
GFR, Estimated: >60 mL/min (ref >60)
Glucose, Bld: 152 mg/dL — ABNORMAL HIGH (ref 70–99)
Potassium: 3.7 mmol/L (ref 3.5–5.1)
Sodium: 141 mmol/L (ref 135–145)
Total Bilirubin: 0.7 mg/dL (ref 0.3–1.2)
Total Protein: 6.6 g/dL (ref 6.5–8.1)

## 2021-03-26 LAB — URINALYSIS, ROUTINE W REFLEX MICROSCOPIC
Bacteria, UA: NONE SEEN
Bilirubin Urine: NEGATIVE
Glucose, UA: >=500 mg/dL — AB
Hgb urine dipstick: NEGATIVE
Ketones, ur: NEGATIVE mg/dL
Leukocytes,Ua: NEGATIVE
Nitrite: NEGATIVE
Protein, ur: NEGATIVE mg/dL
Specific Gravity, Urine: 1.031 — ABNORMAL HIGH (ref 1.005–1.030)
pH: 5 (ref 5.0–8.0)

## 2021-03-26 LAB — TYPE AND SCREEN
ABO/RH(D): O POS
Antibody Screen: NEGATIVE

## 2021-03-26 LAB — PROTIME-INR
INR: 0.9 (ref 0.8–1.2)
Prothrombin Time: 12.3 seconds (ref 11.4–15.2)

## 2021-03-26 LAB — CBC
HCT: 54.1 % — ABNORMAL HIGH (ref 39.0–52.0)
Hemoglobin: 17.9 g/dL — ABNORMAL HIGH (ref 13.0–17.0)
MCH: 32 pg (ref 26.0–34.0)
MCHC: 33.1 g/dL (ref 30.0–36.0)
MCV: 96.8 fL (ref 80.0–100.0)
Platelets: 130 10*3/uL — ABNORMAL LOW (ref 150–400)
RBC: 5.59 MIL/uL (ref 4.22–5.81)
RDW: 12.3 % (ref 11.5–15.5)
WBC: 5.9 10*3/uL (ref 4.0–10.5)
nRBC: 0 % (ref 0.0–0.2)

## 2021-03-26 LAB — SARS CORONAVIRUS 2 (TAT 6-24 HRS): SARS Coronavirus 2: NEGATIVE

## 2021-03-26 LAB — SURGICAL PCR SCREEN
MRSA, PCR: NEGATIVE
Staphylococcus aureus: NEGATIVE

## 2021-03-26 LAB — GLUCOSE, CAPILLARY: Glucose-Capillary: 231 mg/dL — ABNORMAL HIGH (ref 70–99)

## 2021-03-26 LAB — APTT: aPTT: 24 seconds (ref 24–36)

## 2021-03-26 NOTE — Progress Notes (Addendum)
PCP - Selinda Flavin MD Cardiologist - Sheppard Plumber MD  PPM/ICD - Denies   Chest x-ray - N/A EKG - 12/25/2020 Stress Test - Denies ECHO - 02/04/2021 Cardiac Cath - 02/27/2021  Sleep Study - Yes-Per pt Negative CPAP - N/A  Fasting Blood Sugar - 140's Checks Blood Sugar __1__ times a week Instructions given to Not take Jardiance on May 11 and 12.  Pt should continue taking Metformin and hold the DOS.  Blood Thinner Instructions: Plavix, per telephone conversation with Vallarie Mare, pt is to continue Plavix until Thursday May 11 Aspirin Instructions: Continue  ERAS Protcol -No PRE-SURGERY Ensure or G2- N/A  COVID TEST- 03/26/2021  Hgb : 1739  Anesthesia review: Yes, cardiac hx, Abnormal result Hgb 17.9.  Surgeon notified via in box.   Patient denies shortness of breath, fever, cough and chest pain at PAT appointment   All instructions explained to the patient, with a verbal understanding of the material. Patient agrees to go over the instructions while at home for a better understanding. Patient also instructed to self quarantine after being tested for COVID-19. The opportunity to ask questions was provided.

## 2021-03-27 ENCOUNTER — Other Ambulatory Visit (HOSPITAL_COMMUNITY): Payer: BC Managed Care – PPO

## 2021-03-27 DIAGNOSIS — I259 Chronic ischemic heart disease, unspecified: Secondary | ICD-10-CM | POA: Insufficient documentation

## 2021-03-27 DIAGNOSIS — I252 Old myocardial infarction: Secondary | ICD-10-CM | POA: Insufficient documentation

## 2021-03-27 DIAGNOSIS — Z951 Presence of aortocoronary bypass graft: Secondary | ICD-10-CM | POA: Insufficient documentation

## 2021-03-27 NOTE — Progress Notes (Signed)
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 Echo 3/22 showed EF 60-65%, grade 1dd, no significant valvualr abnormalities. Cleared by cardiologist Dr. Dorena Cookey 03/27/2021 stating, "S/P CABG surgery - no angina, CHF, or arrhythmia You are cleared for carotid endarterectomy surgery at intermediate cardiovascular risk. No additional cardiac studies."  Patient with recent history of brief speech deficit lasting less than 5 minutes, and recent left eye vision changes and difficulty with grasping.  In March of this year he underwent work-up with MRI that was negative and duplex which demonstrated 70-99% stenosis on the left.  Current smoker.  History of COPD maintained on Symbicort and as needed albuterol.  Preop labs reviewed, unremarkable.  EKG 12/24/2020: NSR.  Rate 79.  Cerebral angiogram 02/27/2021: IMPRESSION: Status post ultrasound guided access right common femoral artery for cervical and cerebral angiogram, with intention to treat high-grade symptomatic left ICA lesion. Angiogram identified long-segment proximal ICA irregularity secondary to soft plaque, with questionable patent channel. Given possible risk of distal embolization before distal protection and the robust right to left cross-filling, stenting was not pursued at this time.  Carotid duplex 02/04/2021 (Care Everywhere): Right:  Color duplex indicates minimal heterogeneous plaque, with no  hemodynamically significant stenosis by duplex criteria in the  extracranial cerebrovascular circulation.   Left:  Heterogeneous plaque at the left carotid bifurcation, with  discordant results regarding degree of stenosis by established  duplex criteria. Peak velocity and the waveform suggests 70% - 99%  stenosis, with the ICA/ CCA ratio suggesting a lesser degree of  stenosis. If establishing a more accurate degree of stenosis is   required, cerebral angiogram should be considered, or as a second  best test, CTA.   TTE 02/04/2021 (Care Everywhere): Summary  1. The left ventricle is normal in size with mildly increased wall  thickness.  2. The left ventricular systolic function is normal, LVEF is visually  estimated at 60-65%.  3. There is grade I diastolic dysfunction (impaired relaxation).  4. The aortic valve is trileaflet with mildly thickened leaflets with normal  excursion.  5. The left atrium is mildly dilated in size.  6. The right ventricle is normal in size, with normal systolic function.  7. The right atrium is mildly dilated in size.      Zannie Cove [facility] Short Stay Center/Anesthesiology Phone [phone] 03/27/2021 2:30 PM

## 2021-03-27 NOTE — Anesthesia Preprocedure Evaluation (Addendum)
Anesthesia Evaluation  Patient identified by MRN, date of birth, ID band Patient awake    Reviewed: Allergy & Precautions, NPO status , Patient's Chart, lab work & pertinent test results  History of Anesthesia Complications Negative for: history of anesthetic complications  Airway Mallampati: II  TM Distance: >3 FB Neck ROM: Full    Dental no notable dental hx. (+) Dental Advisory Given   Pulmonary COPD, Current Smoker and Patient abstained from smoking.,    Pulmonary exam normal        Cardiovascular hypertension, + CAD and + CABG  Normal cardiovascular exam     Neuro/Psych CVA    GI/Hepatic negative GI ROS,   Endo/Other  diabetes  Renal/GU negative Renal ROS     Musculoskeletal negative musculoskeletal ROS (+)   Abdominal   Peds  Hematology negative hematology ROS (+)   Anesthesia Other Findings   Reproductive/Obstetrics                           Anesthesia Physical Anesthesia Plan  ASA: III  Anesthesia Plan: General   Post-op Pain Management:    Induction: Intravenous  PONV Risk Score and Plan: 3 and Ondansetron, Dexamethasone and Diphenhydramine  Airway Management Planned: Oral ETT  Additional Equipment: Arterial line  Intra-op Plan:   Post-operative Plan: Extubation in OR  Informed Consent: I have reviewed the patients History and Physical, chart, labs and discussed the procedure including the risks, benefits and alternatives for the proposed anesthesia with the patient or authorized representative who has indicated his/her understanding and acceptance.     Dental advisory given  Plan Discussed with: Anesthesiologist, CRNA and Surgeon  Anesthesia Plan Comments: (PAT note by Antionette Poles, PA-C: Follows with cardiology at [facility] for history of CAD s/p NSTEMI 01/01/2017 with subsequent three-vessel CABG on 01/05/2017. Echo 3/22 showed EF 60-65%, grade 1dd, no significant  valvualr abnormalities. Cleared by cardiologist Dr. Dorena Cookey 03/27/2021 stating, "S/P CABG surgery - no angina, CHF, or arrhythmia You are cleared for carotid endarterectomy surgery at intermediate cardiovascular risk. No additional cardiac studies."  Patient with recent history of brief speech deficit lasting less than 5 minutes, and recent left eye vision changes and difficulty with grasping.  In March of this year he underwent work-up with MRI that was negative and duplex which demonstrated 70-99% stenosis on the left.  Current smoker.  History of COPD maintained on Symbicort and as needed albuterol.  Preop labs reviewed, unremarkable.  EKG 12/24/2020: NSR.  Rate 79.  Cerebral angiogram 02/27/2021: IMPRESSION: Status post ultrasound guided access right common femoral artery for cervical and cerebral angiogram, with intention to treat high-grade symptomatic left ICA lesion. Angiogram identified long-segment proximal ICA irregularity secondary to soft plaque, with questionable patent channel. Given possible risk of distal embolization before distal protection and the robust right to left cross-filling, stenting was not pursued at this time.  Carotid duplex 02/04/2021 (Care Everywhere): Right:  Color duplex indicates minimal heterogeneous plaque, with no  hemodynamically significant stenosis by duplex criteria in the  extracranial cerebrovascular circulation.   Left:  Heterogeneous plaque at the left carotid bifurcation, with  discordant results regarding degree of stenosis by established  duplex criteria. Peak velocity and the waveform suggests 70% - 99%  stenosis, with the ICA/ CCA ratio suggesting a lesser degree of  stenosis. If establishing a more accurate degree of stenosis is  required, cerebral angiogram should be considered, or as a second  best test, CTA.  TTE 02/04/2021 (Care Everywhere): Summary  1. The left ventricle is normal in size with mildly increased wall   thickness.  2. The left ventricular systolic function is normal, LVEF is visually  estimated at 60-65%.  3. There is grade I diastolic dysfunction (impaired relaxation).  4. The aortic valve is trileaflet with mildly thickened leaflets with normal  excursion.  5. The left atrium is mildly dilated in size.  6. The right ventricle is normal in size, with normal systolic function.  7. The right atrium is mildly dilated in size.    )      Anesthesia Quick Evaluation

## 2021-03-28 ENCOUNTER — Other Ambulatory Visit: Payer: Self-pay

## 2021-03-28 ENCOUNTER — Encounter (HOSPITAL_COMMUNITY)
Admission: RE | Disposition: A | Discharge status: Home / Self Care | Payer: Self-pay | Source: Home / Self Care | Attending: Vascular Surgery

## 2021-03-28 ENCOUNTER — Inpatient Hospital Stay (HOSPITAL_COMMUNITY)
Admission: RE | Admit: 2021-03-28 | Discharge: 2021-03-29 | DRG: 039 | Disposition: A | Discharge status: Home / Self Care | Payer: BC Managed Care – PPO | Attending: Vascular Surgery | Admitting: Vascular Surgery

## 2021-03-28 ENCOUNTER — Encounter (HOSPITAL_COMMUNITY): Payer: Self-pay | Admitting: Vascular Surgery

## 2021-03-28 ENCOUNTER — Inpatient Hospital Stay (HOSPITAL_COMMUNITY): Payer: BC Managed Care – PPO | Admitting: Physician Assistant

## 2021-03-28 ENCOUNTER — Inpatient Hospital Stay (HOSPITAL_COMMUNITY): Payer: BC Managed Care – PPO | Admitting: Certified Registered Nurse Anesthetist

## 2021-03-28 DIAGNOSIS — J449 Chronic obstructive pulmonary disease, unspecified: Secondary | ICD-10-CM | POA: Diagnosis present

## 2021-03-28 DIAGNOSIS — E785 Hyperlipidemia, unspecified: Secondary | ICD-10-CM | POA: Diagnosis present

## 2021-03-28 DIAGNOSIS — Z8673 Personal history of transient ischemic attack (TIA), and cerebral infarction without residual deficits: Secondary | ICD-10-CM

## 2021-03-28 DIAGNOSIS — I10 Essential (primary) hypertension: Secondary | ICD-10-CM | POA: Diagnosis present

## 2021-03-28 DIAGNOSIS — G2581 Restless legs syndrome: Secondary | ICD-10-CM | POA: Diagnosis present

## 2021-03-28 DIAGNOSIS — I2510 Atherosclerotic heart disease of native coronary artery without angina pectoris: Secondary | ICD-10-CM | POA: Diagnosis present

## 2021-03-28 DIAGNOSIS — E119 Type 2 diabetes mellitus without complications: Secondary | ICD-10-CM | POA: Diagnosis present

## 2021-03-28 DIAGNOSIS — M1990 Unspecified osteoarthritis, unspecified site: Secondary | ICD-10-CM | POA: Diagnosis present

## 2021-03-28 DIAGNOSIS — I252 Old myocardial infarction: Secondary | ICD-10-CM | POA: Diagnosis not present

## 2021-03-28 DIAGNOSIS — I6529 Occlusion and stenosis of unspecified carotid artery: Secondary | ICD-10-CM | POA: Diagnosis present

## 2021-03-28 DIAGNOSIS — Z20822 Contact with and (suspected) exposure to covid-19: Secondary | ICD-10-CM | POA: Diagnosis present

## 2021-03-28 DIAGNOSIS — I6522 Occlusion and stenosis of left carotid artery: Secondary | ICD-10-CM

## 2021-03-28 DIAGNOSIS — Z7982 Long term (current) use of aspirin: Secondary | ICD-10-CM | POA: Diagnosis not present

## 2021-03-28 DIAGNOSIS — K760 Fatty (change of) liver, not elsewhere classified: Secondary | ICD-10-CM | POA: Diagnosis present

## 2021-03-28 DIAGNOSIS — Z7902 Long term (current) use of antithrombotics/antiplatelets: Secondary | ICD-10-CM

## 2021-03-28 DIAGNOSIS — Z951 Presence of aortocoronary bypass graft: Secondary | ICD-10-CM | POA: Diagnosis not present

## 2021-03-28 HISTORY — PX: ENDARTERECTOMY: SHX5162

## 2021-03-28 HISTORY — PX: PATCH ANGIOPLASTY: SHX6230

## 2021-03-28 LAB — GLUCOSE, CAPILLARY
Glucose-Capillary: 143 mg/dL — ABNORMAL HIGH (ref 70–99)
Glucose-Capillary: 148 mg/dL — ABNORMAL HIGH (ref 70–99)
Glucose-Capillary: 258 mg/dL — ABNORMAL HIGH (ref 70–99)
Glucose-Capillary: 227 mg/dL — ABNORMAL HIGH (ref 70–99)

## 2021-03-28 LAB — ABO/RH: ABO/RH(D): O POS

## 2021-03-28 LAB — HEMOGLOBIN A1C
Hgb A1c MFr Bld: 7.2 % — ABNORMAL HIGH (ref 4.8–5.6)
Mean Plasma Glucose: 159.94 mg/dL

## 2021-03-28 SURGERY — ENDARTERECTOMY, CAROTID
Anesthesia: General | Site: Neck | Laterality: Left

## 2021-03-28 MED ORDER — ACETAMINOPHEN 500 MG PO TABS
ORAL_TABLET | ORAL | Status: AC
  Administered 2021-03-28: 1000 mg via ORAL
  Filled 2021-03-28: qty 2

## 2021-03-28 MED ORDER — PRAMIPEXOLE DIHYDROCHLORIDE 0.25 MG PO TABS
0.7500 mg | ORAL_TABLET | Freq: Every day | ORAL | Status: DC
  Administered 2021-03-28: 0.75 mg via ORAL
  Filled 2021-03-28: qty 3

## 2021-03-28 MED ORDER — LACTATED RINGERS IV SOLN: INTRAVENOUS | Status: DC

## 2021-03-28 MED ORDER — SODIUM CHLORIDE 0.9 % IV SOLN: INTRAVENOUS | Status: DC

## 2021-03-28 MED ORDER — SODIUM CHLORIDE 0.9 % IV SOLN
INTRAVENOUS | Status: AC
  Filled 2021-03-28: qty 1.2

## 2021-03-28 MED ORDER — ACETAMINOPHEN 500 MG PO TABS: 1000.0000 mg | ORAL_TABLET | Freq: Once | ORAL | Status: AC

## 2021-03-28 MED ORDER — SODIUM CHLORIDE 0.9 % IV SOLN: 500.0000 mL | Freq: Once | INTRAVENOUS | Status: DC | PRN

## 2021-03-28 MED ORDER — FENTANYL CITRATE (PF) 250 MCG/5ML IJ SOLN
INTRAMUSCULAR | Status: DC | PRN
  Administered 2021-03-28: 50 ug via INTRAVENOUS
  Administered 2021-03-28: 100 ug via INTRAVENOUS

## 2021-03-28 MED ORDER — CEFAZOLIN SODIUM-DEXTROSE 2-4 GM/100ML-% IV SOLN
2.0000 g | INTRAVENOUS | Status: AC
  Administered 2021-03-28: 2 g via INTRAVENOUS
  Filled 2021-03-28: qty 100

## 2021-03-28 MED ORDER — ONDANSETRON HCL 4 MG/2ML IJ SOLN
INTRAMUSCULAR | Status: AC
  Filled 2021-03-28: qty 2

## 2021-03-28 MED ORDER — DIPHENHYDRAMINE HCL 50 MG/ML IJ SOLN
INTRAMUSCULAR | Status: DC | PRN
  Administered 2021-03-28: 6.25 mg via INTRAVENOUS

## 2021-03-28 MED ORDER — LABETALOL HCL 5 MG/ML IV SOLN
10.0000 mg | INTRAVENOUS | Status: DC | PRN
Start: 2021-03-28 — End: 2021-03-29

## 2021-03-28 MED ORDER — CHLORHEXIDINE GLUCONATE CLOTH 2 % EX PADS: 6.0000 | MEDICATED_PAD | Freq: Once | CUTANEOUS | Status: DC

## 2021-03-28 MED ORDER — FENTANYL CITRATE (PF) 100 MCG/2ML IJ SOLN
INTRAMUSCULAR | Status: AC
  Filled 2021-03-28: qty 2

## 2021-03-28 MED ORDER — ONDANSETRON HCL 4 MG/2ML IJ SOLN: 4.0000 mg | Freq: Four times a day (QID) | INTRAMUSCULAR | Status: DC | PRN

## 2021-03-28 MED ORDER — PANTOPRAZOLE SODIUM 40 MG PO TBEC
40.0000 mg | DELAYED_RELEASE_TABLET | Freq: Every day | ORAL | Status: DC
  Administered 2021-03-29: 40 mg via ORAL
  Filled 2021-03-28: qty 1

## 2021-03-28 MED ORDER — MORPHINE SULFATE (PF) 2 MG/ML IV SOLN
2.0000 mg | INTRAVENOUS | Status: DC | PRN
  Administered 2021-03-29: 2 mg via INTRAVENOUS
  Filled 2021-03-28: qty 1

## 2021-03-28 MED ORDER — ESMOLOL HCL 100 MG/10ML IV SOLN
INTRAVENOUS | Status: AC
  Filled 2021-03-28: qty 10

## 2021-03-28 MED ORDER — PROTAMINE SULFATE 10 MG/ML IV SOLN
INTRAVENOUS | Status: DC | PRN
  Administered 2021-03-28: 50 mg via INTRAVENOUS

## 2021-03-28 MED ORDER — DEXAMETHASONE SODIUM PHOSPHATE 10 MG/ML IJ SOLN
INTRAMUSCULAR | Status: AC
  Filled 2021-03-28: qty 1

## 2021-03-28 MED ORDER — METOPROLOL TARTRATE 5 MG/5ML IV SOLN: 2.0000 mg | INTRAVENOUS | Status: DC | PRN

## 2021-03-28 MED ORDER — LOSARTAN POTASSIUM 25 MG PO TABS
25.0000 mg | ORAL_TABLET | Freq: Every day | ORAL | Status: DC
  Administered 2021-03-29: 25 mg via ORAL
  Filled 2021-03-28: qty 1

## 2021-03-28 MED ORDER — ACETAMINOPHEN 325 MG PO TABS: 325.0000 mg | ORAL_TABLET | ORAL | Status: DC | PRN

## 2021-03-28 MED ORDER — METOPROLOL TARTRATE 25 MG PO TABS
25.0000 mg | ORAL_TABLET | Freq: Two times a day (BID) | ORAL | Status: DC
  Administered 2021-03-28 – 2021-03-29 (×2): 25 mg via ORAL
  Filled 2021-03-28 (×3): qty 1

## 2021-03-28 MED ORDER — HYDRALAZINE HCL 20 MG/ML IJ SOLN: 5.0000 mg | INTRAMUSCULAR | Status: DC | PRN

## 2021-03-28 MED ORDER — DIPHENHYDRAMINE HCL 50 MG/ML IJ SOLN
INTRAMUSCULAR | Status: AC
  Filled 2021-03-28: qty 1

## 2021-03-28 MED ORDER — FLUTICASONE FUROATE-VILANTEROL 200-25 MCG/INH IN AEPB: 1.0000 | INHALATION_SPRAY | Freq: Every day | RESPIRATORY_TRACT | Status: DC

## 2021-03-28 MED ORDER — LIDOCAINE 2% (20 MG/ML) 5 ML SYRINGE
INTRAMUSCULAR | Status: AC
  Filled 2021-03-28: qty 5

## 2021-03-28 MED ORDER — CELECOXIB 200 MG PO CAPS: 200.0000 mg | ORAL_CAPSULE | Freq: Once | ORAL | Status: AC

## 2021-03-28 MED ORDER — PROPOFOL 10 MG/ML IV BOLUS
INTRAVENOUS | Status: DC | PRN
  Administered 2021-03-28: 180 mg via INTRAVENOUS

## 2021-03-28 MED ORDER — ONDANSETRON HCL 4 MG/2ML IJ SOLN
INTRAMUSCULAR | Status: DC | PRN
  Administered 2021-03-28: 4 mg via INTRAVENOUS

## 2021-03-28 MED ORDER — CHLORHEXIDINE GLUCONATE 0.12 % MT SOLN
15.0000 mL | Freq: Once | OROMUCOSAL | Status: AC
  Administered 2021-03-28: 15 mL via OROMUCOSAL
  Filled 2021-03-28: qty 15

## 2021-03-28 MED ORDER — LACTATED RINGERS IV SOLN: INTRAVENOUS | Status: DC | PRN

## 2021-03-28 MED ORDER — CYCLOBENZAPRINE HCL 10 MG PO TABS
10.0000 mg | ORAL_TABLET | Freq: Every day | ORAL | Status: DC
  Administered 2021-03-28: 10 mg via ORAL
  Filled 2021-03-28: qty 1

## 2021-03-28 MED ORDER — EPHEDRINE SULFATE-NACL 50-0.9 MG/10ML-% IV SOSY
PREFILLED_SYRINGE | INTRAVENOUS | Status: DC | PRN
  Administered 2021-03-28: 5 mg via INTRAVENOUS

## 2021-03-28 MED ORDER — CLOPIDOGREL BISULFATE 75 MG PO TABS
75.0000 mg | ORAL_TABLET | Freq: Every day | ORAL | Status: DC
  Administered 2021-03-29: 75 mg via ORAL
  Filled 2021-03-28: qty 1

## 2021-03-28 MED ORDER — PHENYLEPHRINE 40 MCG/ML (10ML) SYRINGE FOR IV PUSH (FOR BLOOD PRESSURE SUPPORT)
PREFILLED_SYRINGE | INTRAVENOUS | Status: DC | PRN
  Administered 2021-03-28 (×8): 40 ug via INTRAVENOUS

## 2021-03-28 MED ORDER — CEFAZOLIN SODIUM-DEXTROSE 2-4 GM/100ML-% IV SOLN
2.0000 g | Freq: Three times a day (TID) | INTRAVENOUS | Status: AC
  Administered 2021-03-28 – 2021-03-29 (×2): 2 g via INTRAVENOUS
  Filled 2021-03-28 (×2): qty 100

## 2021-03-28 MED ORDER — CELECOXIB 200 MG PO CAPS
ORAL_CAPSULE | ORAL | Status: AC
  Administered 2021-03-28: 200 mg via ORAL
  Filled 2021-03-28: qty 1

## 2021-03-28 MED ORDER — 0.9 % SODIUM CHLORIDE (POUR BTL) OPTIME
TOPICAL | Status: DC | PRN
  Administered 2021-03-28: 2000 mL

## 2021-03-28 MED ORDER — ACETAMINOPHEN 650 MG RE SUPP
325.0000 mg | RECTAL | Status: DC | PRN
Start: 2021-03-28 — End: 2021-03-29

## 2021-03-28 MED ORDER — DOCUSATE SODIUM 100 MG PO CAPS
100.0000 mg | ORAL_CAPSULE | Freq: Every day | ORAL | Status: DC
  Filled 2021-03-28: qty 1

## 2021-03-28 MED ORDER — FENTANYL CITRATE (PF) 250 MCG/5ML IJ SOLN
INTRAMUSCULAR | Status: AC
  Filled 2021-03-28: qty 5

## 2021-03-28 MED ORDER — HEPARIN SODIUM (PORCINE) 1000 UNIT/ML IJ SOLN
INTRAMUSCULAR | Status: DC | PRN
  Administered 2021-03-28: 10000 [IU] via INTRAVENOUS

## 2021-03-28 MED ORDER — SODIUM CHLORIDE 0.9 % IV SOLN
0.0125 ug/kg/min | INTRAVENOUS | Status: AC
  Administered 2021-03-28: .1 ug/kg/min via INTRAVENOUS
  Filled 2021-03-28: qty 2000

## 2021-03-28 MED ORDER — SODIUM CHLORIDE 0.9 % IV SOLN
INTRAVENOUS | Status: DC | PRN
  Administered 2021-03-28: 500 mL

## 2021-03-28 MED ORDER — ASPIRIN EC 81 MG PO TBEC
81.0000 mg | DELAYED_RELEASE_TABLET | Freq: Every morning | ORAL | Status: DC
  Administered 2021-03-29: 81 mg via ORAL
  Filled 2021-03-28: qty 1

## 2021-03-28 MED ORDER — HEMOSTATIC AGENTS (NO CHARGE) OPTIME
TOPICAL | Status: DC | PRN
  Administered 2021-03-28: 1 via TOPICAL

## 2021-03-28 MED ORDER — PHENOL 1.4 % MT LIQD: 1.0000 | OROMUCOSAL | Status: DC | PRN

## 2021-03-28 MED ORDER — POTASSIUM CHLORIDE CRYS ER 20 MEQ PO TBCR
20.0000 meq | EXTENDED_RELEASE_TABLET | Freq: Every day | ORAL | Status: DC | PRN
Start: 2021-03-28 — End: 2021-03-29

## 2021-03-28 MED ORDER — ROCURONIUM BROMIDE 10 MG/ML (PF) SYRINGE
PREFILLED_SYRINGE | INTRAVENOUS | Status: AC
  Filled 2021-03-28: qty 10

## 2021-03-28 MED ORDER — ALUM & MAG HYDROXIDE-SIMETH 200-200-20 MG/5ML PO SUSP: 15.0000 mL | ORAL | Status: DC | PRN

## 2021-03-28 MED ORDER — ATORVASTATIN CALCIUM 40 MG PO TABS
40.0000 mg | ORAL_TABLET | Freq: Every day | ORAL | Status: DC
  Administered 2021-03-29: 40 mg via ORAL
  Filled 2021-03-28: qty 1

## 2021-03-28 MED ORDER — SUGAMMADEX SODIUM 200 MG/2ML IV SOLN
INTRAVENOUS | Status: DC | PRN
  Administered 2021-03-28: 200 mg via INTRAVENOUS

## 2021-03-28 MED ORDER — LIDOCAINE HCL (PF) 1 % IJ SOLN
INTRAMUSCULAR | Status: AC
  Filled 2021-03-28: qty 30

## 2021-03-28 MED ORDER — OXYCODONE-ACETAMINOPHEN 5-325 MG PO TABS
1.0000 | ORAL_TABLET | ORAL | Status: DC | PRN
Start: 2021-03-28 — End: 2021-03-29
  Administered 2021-03-29: 2 via ORAL
  Filled 2021-03-28: qty 2

## 2021-03-28 MED ORDER — PROTAMINE SULFATE 10 MG/ML IV SOLN
INTRAVENOUS | Status: AC
  Filled 2021-03-28: qty 5

## 2021-03-28 MED ORDER — HYDROCHLOROTHIAZIDE 25 MG PO TABS
25.0000 mg | ORAL_TABLET | Freq: Every day | ORAL | Status: DC
  Administered 2021-03-29: 25 mg via ORAL
  Filled 2021-03-28: qty 1

## 2021-03-28 MED ORDER — ALBUTEROL SULFATE HFA 108 (90 BASE) MCG/ACT IN AERS: 2.0000 | INHALATION_SPRAY | Freq: Four times a day (QID) | RESPIRATORY_TRACT | Status: DC | PRN

## 2021-03-28 MED ORDER — TAMSULOSIN HCL 0.4 MG PO CAPS
0.4000 mg | ORAL_CAPSULE | Freq: Every day | ORAL | Status: DC
  Administered 2021-03-28: 0.4 mg via ORAL
  Filled 2021-03-28: qty 1

## 2021-03-28 MED ORDER — ROCURONIUM BROMIDE 10 MG/ML (PF) SYRINGE
PREFILLED_SYRINGE | INTRAVENOUS | Status: DC | PRN
  Administered 2021-03-28: 80 mg via INTRAVENOUS

## 2021-03-28 MED ORDER — FENTANYL CITRATE (PF) 100 MCG/2ML IJ SOLN
25.0000 ug | INTRAMUSCULAR | Status: DC | PRN
  Administered 2021-03-28: 50 ug via INTRAVENOUS

## 2021-03-28 MED ORDER — DEXAMETHASONE SODIUM PHOSPHATE 10 MG/ML IJ SOLN
INTRAMUSCULAR | Status: DC | PRN
  Administered 2021-03-28: 5 mg via INTRAVENOUS

## 2021-03-28 MED ORDER — GUAIFENESIN-DM 100-10 MG/5ML PO SYRP: 15.0000 mL | ORAL_SOLUTION | ORAL | Status: DC | PRN

## 2021-03-28 MED ORDER — MAGNESIUM SULFATE 2 GM/50ML IV SOLN: 2.0000 g | Freq: Every day | INTRAVENOUS | Status: DC | PRN

## 2021-03-28 MED ORDER — LIDOCAINE 2% (20 MG/ML) 5 ML SYRINGE
INTRAMUSCULAR | Status: DC | PRN
  Administered 2021-03-28: 100 mg via INTRAVENOUS

## 2021-03-28 MED ORDER — PROPOFOL 10 MG/ML IV BOLUS
INTRAVENOUS | Status: AC
  Filled 2021-03-28: qty 40

## 2021-03-28 MED ORDER — INSULIN ASPART 100 UNIT/ML IJ SOLN
0.0000 [IU] | Freq: Three times a day (TID) | INTRAMUSCULAR | Status: DC
  Administered 2021-03-28: 5 [IU] via SUBCUTANEOUS
  Administered 2021-03-29 (×2): 3 [IU] via SUBCUTANEOUS

## 2021-03-28 MED ORDER — PHENYLEPHRINE HCL-NACL 10-0.9 MG/250ML-% IV SOLN
INTRAVENOUS | Status: DC | PRN
  Administered 2021-03-28: 40 ug/min via INTRAVENOUS

## 2021-03-28 MED ORDER — EPHEDRINE 5 MG/ML INJ
INTRAVENOUS | Status: AC
  Filled 2021-03-28: qty 10

## 2021-03-28 MED ORDER — ORAL CARE MOUTH RINSE: 15.0000 mL | Freq: Once | OROMUCOSAL | Status: AC

## 2021-03-28 SURGICAL SUPPLY — 49 items
BAG DECANTER FOR FLEXI CONT (MISCELLANEOUS) ×2 IMPLANT
CANISTER SUCT 3000ML PPV (MISCELLANEOUS) ×2 IMPLANT
CATH ROBINSON RED A/P 18FR (CATHETERS) ×2 IMPLANT
CLIP LIGATING EXTRA MED SLVR (CLIP) ×2 IMPLANT
CLIP LIGATING EXTRA SM BLUE (MISCELLANEOUS) ×2 IMPLANT
CLIP VESOCCLUDE MED 24/CT (CLIP) ×4 IMPLANT
CLIP VESOCCLUDE SM WIDE 24/CT (CLIP) ×4 IMPLANT
COVER PROBE W GEL 5X96 (DRAPES) ×2 IMPLANT
COVER WAND RF STERILE (DRAPES) ×2 IMPLANT
DERMABOND ADVANCED (GAUZE/BANDAGES/DRESSINGS) ×1
DERMABOND ADVANCED .7 DNX12 (GAUZE/BANDAGES/DRESSINGS) ×1 IMPLANT
DRAIN CHANNEL 15F RND FF W/TCR (WOUND CARE) IMPLANT
DRAPE HALF SHEET 40X57 (DRAPES) ×2 IMPLANT
ELECT REM PT RETURN 9FT ADLT (ELECTROSURGICAL) ×2
ELECTRODE REM PT RTRN 9FT ADLT (ELECTROSURGICAL) ×1 IMPLANT
EVACUATOR SILICONE 100CC (DRAIN) IMPLANT
GLOVE SURG MICRO LTX SZ7.5 (GLOVE) ×2 IMPLANT
GOWN STRL REUS W/ TWL LRG LVL3 (GOWN DISPOSABLE) ×2 IMPLANT
GOWN STRL REUS W/ TWL XL LVL3 (GOWN DISPOSABLE) ×1 IMPLANT
GOWN STRL REUS W/TWL LRG LVL3 (GOWN DISPOSABLE) ×2
GOWN STRL REUS W/TWL XL LVL3 (GOWN DISPOSABLE) ×1
HEMOSTAT SNOW SURGICEL 2X4 (HEMOSTASIS) IMPLANT
INSERT FOGARTY SM (MISCELLANEOUS) IMPLANT
IV ADAPTER SYR DOUBLE MALE LL (MISCELLANEOUS) IMPLANT
KIT BASIN OR (CUSTOM PROCEDURE TRAY) ×2 IMPLANT
KIT SHUNT ARGYLE CAROTID ART 6 (VASCULAR PRODUCTS) ×2 IMPLANT
KIT TURNOVER KIT B (KITS) ×2 IMPLANT
LOOP VESSEL MAXI BLUE (MISCELLANEOUS) ×2 IMPLANT
NEEDLE HYPO 25GX1X1/2 BEV (NEEDLE) ×2 IMPLANT
NEEDLE SPNL 20GX3.5 QUINCKE YW (NEEDLE) IMPLANT
NS IRRIG 1000ML POUR BTL (IV SOLUTION) ×6 IMPLANT
PACK CAROTID (CUSTOM PROCEDURE TRAY) ×2 IMPLANT
PAD ARMBOARD 7.5X6 YLW CONV (MISCELLANEOUS) ×4 IMPLANT
PATCH VASC XENOSURE 1CMX6CM (Vascular Products) ×1 IMPLANT
PATCH VASC XENOSURE 1X6 (Vascular Products) ×1 IMPLANT
POSITIONER HEAD DONUT 9IN (MISCELLANEOUS) ×2 IMPLANT
POWDER SURGICEL 3.0 GRAM (HEMOSTASIS) ×2 IMPLANT
STOPCOCK 4 WAY LG BORE MALE ST (IV SETS) IMPLANT
SUT ETHILON 3 0 PS 1 (SUTURE) IMPLANT
SUT MNCRL AB 4-0 PS2 18 (SUTURE) ×2 IMPLANT
SUT PROLENE 6 0 BV (SUTURE) ×10 IMPLANT
SUT SILK 3 0 (SUTURE)
SUT SILK 3-0 18XBRD TIE 12 (SUTURE) IMPLANT
SUT VIC AB 3-0 SH 27 (SUTURE) ×1
SUT VIC AB 3-0 SH 27X BRD (SUTURE) ×1 IMPLANT
SYR CONTROL 10ML LL (SYRINGE) IMPLANT
TOWEL GREEN STERILE (TOWEL DISPOSABLE) ×2 IMPLANT
TUBING ART PRESS 48 MALE/FEM (TUBING) IMPLANT
WATER STERILE IRR 1000ML POUR (IV SOLUTION) ×2 IMPLANT

## 2021-03-28 NOTE — Progress Notes (Signed)
  Day of Surgery Note    Subjective:  No complaints; wants to go home today   Vitals:   03/28/21 1047 03/28/21 1118  BP: 96/66 97/64  Pulse: 77 75  Resp: 11 13  Temp:    SpO2: 94% 94%    Incisions:   Clean and dry without hematoma Extremities:  Moving all extremities Lungs:  Non labored Neuro:  In tact; tongue is midline   Assessment/Plan:  This is a 70 y.o. male who is s/p  Left CEA  -pt doing well in recovery -to 4 east later today -anticipate discharge tomorrow if evening uneventful   Doreatha Massed, PA-C 03/28/2021 11:24 AM [phone]

## 2021-03-28 NOTE — Interval H&P Note (Signed)
History and Physical Interval Note:  03/28/2021 7:20 AM  Jeffrey Matthews  has presented today for surgery, with the diagnosis of Symptomatic Left Carotid Artery Stenosis.  The various methods of treatment have been discussed with the patient and family. After consideration of risks, benefits and other options for treatment, the patient has consented to  Procedure(s): LEFT CAROTID ENDARTERECTOMY (Left) as a surgical intervention.  The patient's history has been reviewed, patient examined, no change in status, stable for surgery.  I have reviewed the patient's chart and labs.  Questions were answered to the patient's satisfaction.     Lemar Livings

## 2021-03-28 NOTE — Anesthesia Procedure Notes (Signed)
Procedure Name: Intubation Date/Time: 03/28/2021 7:37 AM Performed by: Janene Harvey, CRNA Pre-anesthesia Checklist: Patient identified, Emergency Drugs available, Suction available and Patient being monitored Patient Re-evaluated:Patient Re-evaluated prior to induction Oxygen Delivery Method: Circle system utilized Preoxygenation: Pre-oxygenation with 100% oxygen Induction Type: IV induction Ventilation: Mask ventilation without difficulty Laryngoscope Size: Mac and 4 Grade View: Grade II Tube type: Oral Tube size: 7.5 mm Number of attempts: 1 Airway Equipment and Method: Stylet and Oral airway Placement Confirmation: ETT inserted through vocal cords under direct vision,  positive ETCO2 and breath sounds checked- equal and bilateral Secured at: 23 cm Tube secured with: Tape Dental Injury: Teeth and Oropharynx as per pre-operative assessment

## 2021-03-28 NOTE — Transfer of Care (Signed)
Immediate Anesthesia Transfer of Care Note  Patient: [name]  Procedure(s) Performed: LEFT CAROTID ENDARTERECTOMY (Left Neck) BOVINE PATCH ANGIOPLASTY (Left Neck)  Patient Location: PACU  Anesthesia Type:General  Level of Consciousness: awake, alert  and patient cooperative  Airway & Oxygen Therapy: Patient Spontanous Breathing  Post-op Assessment: Report given to RN, Post -op Vital signs reviewed and stable, Patient moving all extremities X 4 and Patient able to stick tongue midline  Post vital signs: Reviewed  Last Vitals:  Vitals Value Taken Time  BP 107/68 03/28/21 1005  Temp    Pulse 90 03/28/21 1007  Resp 17 03/28/21 1007  SpO2 95 % 03/28/21 1007  Vitals shown include unvalidated device data.  Last Pain:  Vitals:   03/28/21 0616  TempSrc:   PainSc: 0-No pain      Patients Stated Pain Goal: 3 (03/28/21 4332)  Complications: No complications documented.

## 2021-03-28 NOTE — Anesthesia Postprocedure Evaluation (Signed)
Anesthesia Post Note  Patient: Jeffrey Matthews  Procedure(s) Performed: LEFT CAROTID ENDARTERECTOMY (Left Neck) BOVINE PATCH ANGIOPLASTY (Left Neck)     Patient location during evaluation: PACU Anesthesia Type: General Level of consciousness: sedated Pain management: pain level controlled Vital Signs Assessment: post-procedure vital signs reviewed and stable Respiratory status: spontaneous breathing and respiratory function stable Cardiovascular status: stable Postop Assessment: no apparent nausea or vomiting Anesthetic complications: no   No complications documented.  Last Vitals:  Vitals:   03/28/21 1047 03/28/21 1118  BP: 96/66 97/64  Pulse: 77 75  Resp: 11 13  Temp:    SpO2: 94% 94%    Last Pain:  Vitals:   03/28/21 1118  TempSrc:   PainSc: 4                  Abdirizak Richison DANIEL

## 2021-03-28 NOTE — Op Note (Signed)
Patient name: Jeffrey Matthews MRN: [id number] DOB: Jan 21, 1951 Sex: male  03/28/2021 Pre-operative Diagnosis: symptomatic left ica stenosis Post-operative diagnosis:  Same Surgeon:  Apolinar Junes C. Randie Heinz, MD Assistant: Aggie Moats, PA Procedure Performed:  Left carotid endarterectomy with bovine pericardial patch angioplasty  Indications: 70 year old male with 2 episodes of symptomatic left ICA stenosis.  He underwent carotid angiography which demonstrated very tight stenosis with subtotal occlusion of the carotid.  The carotid was noted to be patent by duplex there was antegrade flow by angiography.  He was subsequently indicated for carotid endarterectomy.  An assistant was necessary to expedite the case.  Findings: The common carotid artery was calcified extending into the ICA and for approximately 3 cm there was what appeared to be ruptured plaque with thrombus.  Initially on cutting open the artery we had black thrombus removed.  There was very strong backbleeding and given the amount of thrombus I elected not to shunt.  After endarterectomy there was smooth tapering distally after patch angioplasty there was expected low resistance signal in the distal ICA.  Patient was neurologically intact upon awakening from anesthesia.   Procedure:  The patient was identified in the holding area and taken to the operating was placed supine on operative table and general anesthesia was induced.  He was sterilely prepped draped in the left neck and chest in usual fashion, antibiotics were administered and a timeout was called.  We began with longitudinal incisional on the neck at the anterior border the sternocleidomastoid curving toward the mastoid process 1 cm below the jawline.  We dissected through the skin subcutaneous tissue and platysma.  We identified the sternocleidomastoid retracted this laterally.  We identified a facial vein this was divided between ties.  We identified the common carotid artery the  patient was fully heparinized and ACT returned approximately 300.  We placed an umbilical tape around the common carotid artery.  We dissected up to the external carotid artery and placed a vessel loop around this as well as the superior thyroidal branch.  We then identified her hypoglossal nerve.  The internal carotid artery was initially thickened there was a pulse within it and gently palpating it and then it was much smaller higher up.  We did divide the ansa cervicalis for better exposure.  We were able to get higher up on the ICA which appeared externally normal we placed a vessel loop around this.  We then prepared a 10 [place] shunt.  The blood pressure was MAP of approximately 100.  We clamped the ICA followed by the common carotid artery and external carotid artery.  We opened longitudinally initially with 11 blade extended with Potts scissors.  Upon entering there was significant thrombus there.  We did open high enough until there was no further plaque.  We had very strong pulsatile backbleeding.  Given that there was thrombus in the field I elected not to shunt with concern for embolization.  We had very strong inflow and smooth tapering of the common carotid artery distally.  We then proceeded with endarterectomy including eversion of the external we had good backbleeding from the external.  Distally we did have to work extensively we did get a good taper distally did not need to tack.  We then prepared a bovine pericardial patch and sewed this in place with 6-0 Prolene suture.  Prior completion we allowed backbleeding and antegrade bleeding we then thoroughly flushed the carotid endarterectomy bulb.  We then completed our anastomosis.  The external  carotid artery was opened followed by the common carotid artery and after several cardiac cycles the internal carotid artery.  We did have to place a few repair stitches.  Doppler demonstrated very good signal distally low resistance.  We administered 50 mg  of protamine.  We thoroughly irrigated the wound and obtain hemostasis.  We closed the platysma with Vicryl followed by skin with Monocryl.  Dermabond is placed at the level of skin.  He was then awakened from anesthesia having tolerated procedure without any complication.  All counts were correct at completion.   EBL: 100cc  Coleson Kant C. Randie Heinz, MD [facility] Office: [phone] Pager: [phone]

## 2021-03-28 NOTE — Discharge Instructions (Signed)
   [facility]  Discharge Instructions   Carotid Surgery  Please refer to the following instructions for your post-procedure care. Your surgeon or physician assistant will discuss any changes with you.  Activity  You are encouraged to walk as much as you can. You can slowly return to normal activities but must avoid strenuous activity and heavy lifting until your doctor tell you it's okay. Avoid activities such as vacuuming or swinging a golf club. You can drive after one week if you are comfortable and you are no longer taking prescription pain medications. It is normal to feel tired for serval weeks after your surgery. It is also normal to have difficulty with sleep habits, eating, and bowel movements after surgery. These will go away with time.  Bathing/Showering  Shower daily after you go home. Do not soak in a bathtub, hot tub, or swim until the incision heals completely.  Incision Care  Shower every day. Clean your incision with mild soap and water. Pat the area dry with a clean towel. You do not need a bandage unless otherwise instructed. Do not apply any ointments or creams to your incision. You may have skin glue on your incision. Do not peel it off. It will come off on its own in about one week. Your incision may feel thickened and raised for several weeks after your surgery. This is normal and the skin will soften over time.   For Men Only: It's okay to shave around the incision but do not shave the incision itself for 2 weeks. It is common to have numbness under your chin that could last for several months.  Diet  Resume your normal diet. There are no special food restrictions following this procedure. A low fat/low cholesterol diet is recommended for all patients with vascular disease. In order to heal from your surgery, it is CRITICAL to get adequate nutrition. Your body requires vitamins, minerals, and protein. Vegetables are the best source of  vitamins and minerals. Vegetables also provide the perfect balance of protein. Processed food has little nutritional value, so try to avoid this.  Medications  Resume taking all of your medications unless your doctor or physician assistant tells you not to. If your incision is causing pain, you may take over-the- counter pain relievers such as acetaminophen (Tylenol). If you were prescribed a stronger pain medication, please be aware these medications can cause nausea and constipation. Prevent nausea by taking the medication with a snack or meal. Avoid constipation by drinking plenty of fluids and eating foods with a high amount of fiber, such as fruits, vegetables, and grains.   Do not take Tylenol if you are taking prescription pain medications.  Follow Up  Our office will schedule a follow up appointment 2-3 weeks following discharge.  Please call us immediately for any of the following conditions  . Increased pain, redness, drainage (pus) from your incision site. . Fever of 101 degrees or higher. . If you should develop stroke (slurred speech, difficulty swallowing, weakness on one side of your body, loss of vision) you should call 911 and go to the nearest emergency room. .  Reduce your risk of vascular disease:  . Stop smoking. If you would like help call [facility] at 1-800-QUIT-NOW (1-800-784-8669) or [facility] at 336-586-4000. . Manage your cholesterol . Maintain a desired weight . Control your diabetes . Keep your blood pressure down .  If you have any questions, please call the office at [phone]. 

## 2021-03-28 NOTE — Anesthesia Procedure Notes (Signed)
Arterial Line Insertion Start/End5/10/2021 6:55 AM Performed by: Audie Pinto, CRNA, CRNA  Patient location: Pre-op. Lidocaine 1% used for infiltration Left, radial was placed Catheter size: 20 G Hand hygiene performed , maximum sterile barriers used  and Seldinger technique used Allen's test indicative of satisfactory collateral circulation Attempts: 1 Procedure performed without using ultrasound guided technique. Following insertion, dressing applied and Biopatch. Post procedure assessment: unchanged and normal  Patient tolerated the procedure well with no immediate complications.

## 2021-03-28 NOTE — Progress Notes (Signed)
Mobility Specialist - Progress Note   03/28/21 1616  Mobility  Activity Ambulated in hall  Level of Assistance Standby assist, set-up cues, supervision of patient - no hands on  Assistive Device None  Distance Ambulated (ft) 430 ft  Mobility Ambulated with assistance in hallway  Mobility Response Tolerated well  Mobility performed by Mobility specialist  $Mobility charge 1 Mobility   Pre-mobility: 74 HR During mobility: 105 HR Post-mobility: 85 HR  Pt asx throughout ambulation. Assistance only for management of lines. Pt back in bed after walk.   Mamie Levers Mobility Specialist Mobility Specialist Phone: [phone]

## 2021-03-29 ENCOUNTER — Encounter: Payer: Self-pay | Admitting: Vascular Surgery

## 2021-03-29 ENCOUNTER — Encounter (HOSPITAL_COMMUNITY): Payer: Self-pay | Admitting: Vascular Surgery

## 2021-03-29 ENCOUNTER — Telehealth: Payer: Self-pay | Admitting: Vascular Surgery

## 2021-03-29 ENCOUNTER — Encounter: Payer: BC Managed Care – PPO | Admitting: Vascular Surgery

## 2021-03-29 LAB — BASIC METABOLIC PANEL
Anion gap: 8 (ref 5–15)
BUN: 15 mg/dL (ref 8–23)
CO2: 24 mmol/L (ref 22–32)
Calcium: 8.5 mg/dL — ABNORMAL LOW (ref 8.9–10.3)
Chloride: 103 mmol/L (ref 98–111)
Creatinine, Ser: 0.91 mg/dL (ref 0.61–1.24)
GFR, Estimated: >60 mL/min (ref >60)
Glucose, Bld: 158 mg/dL — ABNORMAL HIGH (ref 70–99)
Potassium: 4 mmol/L (ref 3.5–5.1)
Sodium: 135 mmol/L (ref 135–145)

## 2021-03-29 LAB — CBC
HCT: 43.5 % (ref 39.0–52.0)
Hemoglobin: 14.7 g/dL (ref 13.0–17.0)
MCH: 32.3 pg (ref 26.0–34.0)
MCHC: 33.8 g/dL (ref 30.0–36.0)
MCV: 95.6 fL (ref 80.0–100.0)
Platelets: 109 10*3/uL — ABNORMAL LOW (ref 150–400)
RBC: 4.55 MIL/uL (ref 4.22–5.81)
RDW: 12.1 % (ref 11.5–15.5)
WBC: 8.4 10*3/uL (ref 4.0–10.5)
nRBC: 0 % (ref 0.0–0.2)

## 2021-03-29 LAB — POCT ACTIVATED CLOTTING TIME: Activated Clotting Time: 297 seconds

## 2021-03-29 LAB — GLUCOSE, CAPILLARY
Glucose-Capillary: 156 mg/dL — ABNORMAL HIGH (ref 70–99)
Glucose-Capillary: 178 mg/dL — ABNORMAL HIGH (ref 70–99)

## 2021-03-29 MED ORDER — OXYCODONE-ACETAMINOPHEN 5-325 MG PO TABS: 1.0000 | ORAL_TABLET | ORAL | 0 refills | Status: AC | PRN

## 2021-03-29 NOTE — Progress Notes (Signed)
Patient given discharge instructions and stated understanding. 

## 2021-03-29 NOTE — Progress Notes (Signed)
Pt discharged to home with wife.  Pt's IV removed.  Pt taken off telemetry and CCMD notified.  Pt left with all of their personal belongings.  AVS documentation reviewed and sent home with Pt and all questions answered.   

## 2021-03-29 NOTE — Progress Notes (Signed)
[facility]  Subjective  - Doing well and is ready to go home.   Objective 119/83 72 98.4 F (36.9 C) (Oral) 20 99%  Intake/Output Summary (Last 24 hours) at 03/29/2021 0732 Last data filed at 03/28/2021 1700 Gross per 24 hour  Intake 1653.67 ml  Output 300 ml  Net 1353.67 ml    Left neck incision healing well without hematoma No tongue deviation and no facial droop Moving all 4 ext. Lungs non labored breathing  Assessment/Planning: POD # 1 Left carotid endarterectomy with bovine pericardial patch angioplasty  No neurologic deficits.  Ambulating, voided and tolerating PO's Stable for discharge F/U in 2-3 weeks   Mosetta Pigeon 03/29/2021 7:32 AM --  Laboratory Lab Results: Recent Labs    03/26/21 0943  WBC 5.9  HGB 17.9*  HCT 54.1*  PLT 130*   BMET Recent Labs    03/26/21 0943 03/29/21 0434  NA 141 135  K 3.7 4.0  CL 105 103  CO2 28 24  GLUCOSE 152* 158*  BUN 10 15  CREATININE 0.94 0.91  CALCIUM 9.3 8.5*    COAG Lab Results  Component Value Date   INR 0.9 03/26/2021   INR 1.0 02/27/2021   INR 0.9 12/24/2020   No results found for: PTT

## 2021-03-29 NOTE — Telephone Encounter (Signed)
Jeffrey Matthews called the office today regarding his post op and was transferred to me. I advised him of his upcoming appt on 6/3 however he wants to know if he can go back to work after 5/23. His disability ends on that date and states he can extend it to 5/28 if needed. Please advise. Thanks

## 2021-03-29 NOTE — Plan of Care (Signed)

## 2021-04-01 NOTE — Discharge Summary (Signed)
[facility] Discharge Summary   Patient ID:  Jeffrey Matthews MRN: [id number] DOB/AGE: 1951/09/21 70 y.o.  Admit date: 03/28/2021 Discharge date: 04/01/2021 Date of Surgery: 03/29/2021 Surgeon: Surgeon(s): Maeola Harman, MD  Admission Diagnosis: Carotid artery stenosis [I65.29]  Discharge Diagnoses:  Carotid artery stenosis [I65.29]  Secondary Diagnoses: Past Medical History:  Diagnosis Date  . Arthritis   . Asthma   . Complication of anesthesia    slow to wake up  . COPD (chronic obstructive pulmonary disease) (HCC)   . Coronary artery disease   . Diabetes (HCC)    type 2  . Fatty liver   . Headache   . Hepatitis    fatty liver  . Hyperlipidemia   . Hypertension   . Myocardial infarction (HCC)   . Restless legs   . Stroke (HCC)     Procedure(s): LEFT CAROTID ENDARTERECTOMY BOVINE PATCH ANGIOPLASTY  Discharged Condition: stable  HPI:  70 year old male with 2 episodes of symptomatic left ICA stenosis.  He underwent carotid angiography which demonstrated very tight stenosis with subtotal occlusion of the carotid.   Hospital Course:  Jeffrey Matthews is a 70 y.o. male is S/P  Procedure(s): LEFT CAROTID ENDARTERECTOMY BOVINE PATCH ANGIOPLASTY POD # 1 Left carotid endarterectomy with bovine pericardial patch angioplasty  No neurologic deficits.  Ambulating, voided and tolerating PO's Stable for discharge F/U in 2-3 weeks    Significant Diagnostic Studies: CBC Lab Results  Component Value Date   WBC 8.4 03/29/2021   HGB 14.7 03/29/2021   HCT 43.5 03/29/2021   MCV 95.6 03/29/2021   PLT 109 (L) 03/29/2021    BMET    Component Value Date/Time   NA 135 03/29/2021 0434   K 4.0 03/29/2021 0434   CL 103 03/29/2021 0434   CO2 24 03/29/2021 0434   GLUCOSE 158 (H) 03/29/2021 0434   BUN 15 03/29/2021 0434   CREATININE 0.91 03/29/2021 0434   CALCIUM 8.5 (L) 03/29/2021 0434   GFRNONAA >60 03/29/2021 0434   COAG Lab Results   Component Value Date   INR 0.9 03/26/2021   INR 1.0 02/27/2021   INR 0.9 12/24/2020     Disposition:  Discharge to :Home Discharge Instructions    Call MD for:  redness, tenderness, or signs of infection (pain, swelling, bleeding, redness, odor or green/yellow discharge around incision site)   Complete by: As directed    Call MD for:  severe or increased pain, loss or decreased feeling  in affected limb(s)   Complete by: As directed    Call MD for:  temperature >100.5   Complete by: As directed    Resume previous diet   Complete by: As directed      Allergies as of 03/29/2021   No Known Allergies     Medication List    TAKE these medications   albuterol 108 (90 Base) MCG/ACT inhaler Commonly known as: VENTOLIN HFA Inhale 2 puffs into the lungs every 6 (six) hours as needed for wheezing or shortness of breath.   aspirin EC 81 MG tablet Take 81 mg by mouth every morning.   budesonide-formoterol 160-4.5 MCG/ACT inhaler Commonly known as: SYMBICORT Inhale 2 puffs into the lungs daily as needed (asthma).   clopidogrel 75 MG tablet Commonly known as: PLAVIX Take 75 mg by mouth daily.   cyclobenzaprine 10 MG tablet Commonly known as: FLEXERIL Take 10 mg by mouth at bedtime.   empagliflozin 25 MG Tabs tablet Commonly known as: JARDIANCE Take 25  mg by mouth daily.   hydrochlorothiazide 25 MG tablet Commonly known as: HYDRODIURIL Take 25 mg by mouth daily.   losartan 25 MG tablet Commonly known as: COZAAR Take 25 mg by mouth daily.   metFORMIN 500 MG tablet Commonly known as: GLUCOPHAGE Take 500 mg by mouth 4 (four) times daily -  with meals and at bedtime.   metoprolol tartrate 25 MG tablet Commonly known as: LOPRESSOR Take 25 mg by mouth 2 (two) times daily.   oxyCODONE-acetaminophen 5-325 MG tablet Commonly known as: PERCOCET/ROXICET Take 1-2 tablets by mouth every 4 (four) hours as needed for moderate pain.   Ozempic (1 MG/DOSE) 4 MG/3ML Sopn Generic  drug: Semaglutide (1 MG/DOSE) Inject 1 mg into the muscle every Monday.   pramipexole 0.75 MG tablet Commonly known as: MIRAPEX Take 0.75 mg by mouth at bedtime.   sildenafil 50 MG tablet Commonly known as: VIAGRA Take 50 mg by mouth as needed for erectile dysfunction.   simvastatin 80 MG tablet Commonly known as: ZOCOR Take 80 mg by mouth at bedtime.   tamsulosin 0.4 MG Caps capsule Commonly known as: FLOMAX Take 0.4 mg by mouth at bedtime.   testosterone cypionate 200 MG/ML injection Commonly known as: DEPOTESTOSTERONE CYPIONATE Inject 100 mg into the muscle every 14 (fourteen) days.      Verbal and written Discharge instructions given to the patient. Wound care per Discharge AVS  Follow-up Information    [facility] In 3 weeks.   Specialty: Vascular Surgery Why: Office will call you to arrange your appt (sent) Contact information: [address] [place] [address] [phone]              Signed: Mosetta Pigeon 04/01/2021, 9:30 AM  --- For VQI Registry use --- Instructions: Press F2 to tab through selections.  Delete question if not applicable.   Modified Rankin score at D/C (0-6): Rankin Score=0  IV medication needed for:  1. Hypertension: No 2. Hypotension: No  Post-op Complications: No  1. Post-op CVA or TIA: No  If yes: Event classification (right eye, left eye, right cortical, left cortical, verterobasilar, other):   If yes: Timing of event (intra-op, <6 hrs post-op, >=6 hrs post-op, unknown):   2. CN injury: No  If yes: CN  injuried   3. Myocardial infarction: No  If yes: Dx by (EKG or clinical, Troponin):   4.  CHF: No  5.  Dysrhythmia (new): No  6. Wound infection: No  7. Reperfusion symptoms: No  8. Return to OR: No  If yes: return to OR for (bleeding, neurologic, other CEA incision, other):   Discharge medications: Statin use:  Yes ASA use:  Yes Beta blocker use:   Yes ACE-Inhibitor use:  No  for medical reason   P2Y12 Antagonist use: [ ]  None, [x ] Plavix, [ ]  Plasugrel, [ ]  Ticlopinine, [ ]  Ticagrelor, [ ]  Other, [ ]  No for medical reason, [ ]  Non-compliant, [ ]  Not-indicated Anti-coagulant use:  [ x] None, [ ]  Warfarin, [ ]  Rivaroxaban, [ ]  Dabigatran, [ ]  Other, [ ]  No for medical reason, [ ]  Non-compliant, [ ]  Not-indicated

## 2021-04-19 ENCOUNTER — Other Ambulatory Visit: Payer: Self-pay

## 2021-04-19 ENCOUNTER — Ambulatory Visit (INDEPENDENT_AMBULATORY_CARE_PROVIDER_SITE_OTHER): Payer: BC Managed Care – PPO | Admitting: Physician Assistant

## 2021-04-19 VITALS — BP 97/65 | HR 74 | Temp 98.1°F | Resp 20 | Ht 72.0 in | Wt 212.5 lb

## 2021-04-19 DIAGNOSIS — I6523 Occlusion and stenosis of bilateral carotid arteries: Secondary | ICD-10-CM

## 2021-04-19 NOTE — Progress Notes (Signed)
  POST OPERATIVE OFFICE NOTE    CC:  F/u for surgery  HPI:  This is a 70 y.o. male who is s/p left CEA for symptomatic carotid stenosis on 03/28/21 by Dr. Randie Heinz.    Pt returns today for follow up.  Pt states He has no pain, swallowing difficulties or weakness in his ext.  He denise fever and chills.  He has a small area of the jaw that has decreased sensation.  Allergies  Allergen Reactions  . Levonorgestrel-Ethinyl Estrad     Other reaction(s): Other (See Comments) Sneezing    Current Outpatient Medications  Medication Sig Dispense Refill  . albuterol (VENTOLIN HFA) 108 (90 Base) MCG/ACT inhaler Inhale 2 puffs into the lungs every 6 (six) hours as needed for wheezing or shortness of breath.    Marland Kitchen aspirin EC 81 MG tablet Take 81 mg by mouth every morning.    . budesonide-formoterol (SYMBICORT) 160-4.5 MCG/ACT inhaler Inhale 2 puffs into the lungs daily as needed (asthma).    . clopidogrel (PLAVIX) 75 MG tablet Take 75 mg by mouth daily.    . cyclobenzaprine (FLEXERIL) 10 MG tablet Take 10 mg by mouth at bedtime.    . empagliflozin (JARDIANCE) 25 MG TABS tablet Take 25 mg by mouth daily.    . hydrochlorothiazide (HYDRODIURIL) 25 MG tablet Take 25 mg by mouth daily.    Marland Kitchen losartan (COZAAR) 25 MG tablet Take 25 mg by mouth daily.    . metFORMIN (GLUCOPHAGE) 500 MG tablet Take 500 mg by mouth 4 (four) times daily -  with meals and at bedtime.    . metoprolol tartrate (LOPRESSOR) 25 MG tablet Take 25 mg by mouth 2 (two) times daily.    Marland Kitchen oxyCODONE-acetaminophen (PERCOCET/ROXICET) 5-325 MG tablet Take 1-2 tablets by mouth every 4 (four) hours as needed for moderate pain. 20 tablet 0  . OZEMPIC, 1 MG/DOSE, 4 MG/3ML SOPN Inject 1 mg into the muscle every Monday.    . pramipexole (MIRAPEX) 0.75 MG tablet Take 0.75 mg by mouth at bedtime.    . sildenafil (VIAGRA) 50 MG tablet Take 50 mg by mouth as needed for erectile dysfunction.    . simvastatin (ZOCOR) 80 MG tablet Take 80 mg by mouth at  bedtime.    . tamsulosin (FLOMAX) 0.4 MG CAPS capsule Take 0.4 mg by mouth at bedtime.    Marland Kitchen testosterone cypionate (DEPOTESTOSTERONE CYPIONATE) 200 MG/ML injection Inject 100 mg into the muscle every 14 (fourteen) days.     No current facility-administered medications for this visit.     ROS:  See HPI  Physical Exam:    Incision:  Well healed Extremities:  Moving all 4 ext with MMT 5/5 B UE/LE No tongue deviation or facial droop Lungs: non labored breathing    Assessment/Plan:  This is a 70 y.o. male who is s/p:eft CEA for symptomatic carotid stenosis on 03/28/21 by Dr. Randie Heinz.    Activity as tolerates.  He will return in 9 months for repeat carotid duplex surveillant.        Mosetta Pigeon PA-C [facility] [phone]   Clinic MD:  Randie Heinz

## 2021-04-23 ENCOUNTER — Other Ambulatory Visit: Payer: Self-pay

## 2021-04-23 DIAGNOSIS — I6523 Occlusion and stenosis of bilateral carotid arteries: Secondary | ICD-10-CM

## 2021-12-31 IMAGING — CT CT HEAD W/O CM
4 series · 16 of 47 positions shown, 18 images · non-contrast
Comparison: None.

CLINICAL DATA: TIA, blurred vision and slurred speech episode on
[REDACTED]

EXAM:
CT HEAD WITHOUT CONTRAST
TECHNIQUE: Contiguous axial images were obtained from the base of the skull
through the vertex without intravenous contrast.

[Series 2: head without · axial · non-contrast · 0.41mm/px · z∈[-137,-12]mm · 7 of 35 slices shown, 9 images]
[im 5/35  brain]
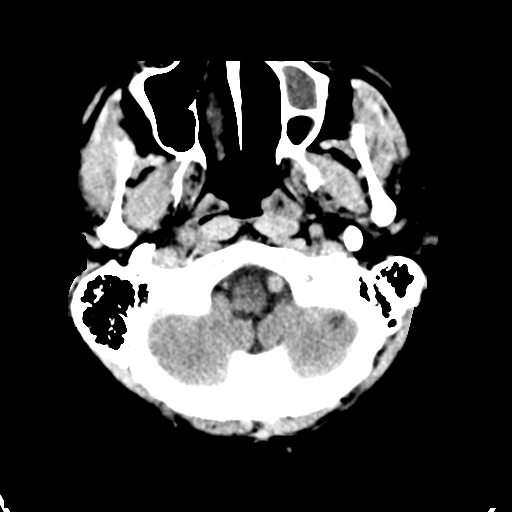
[im 5/35  bone]
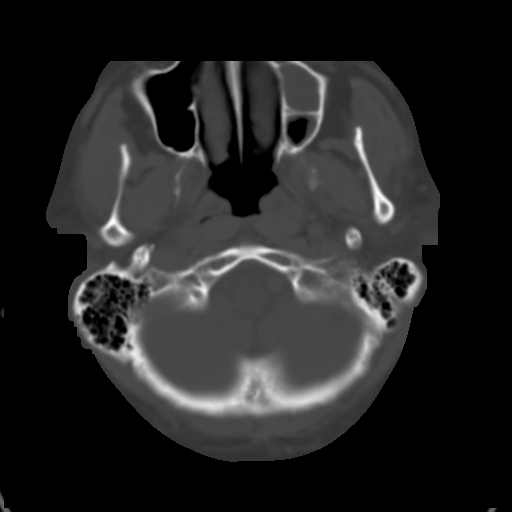
[im 9/35  brain]
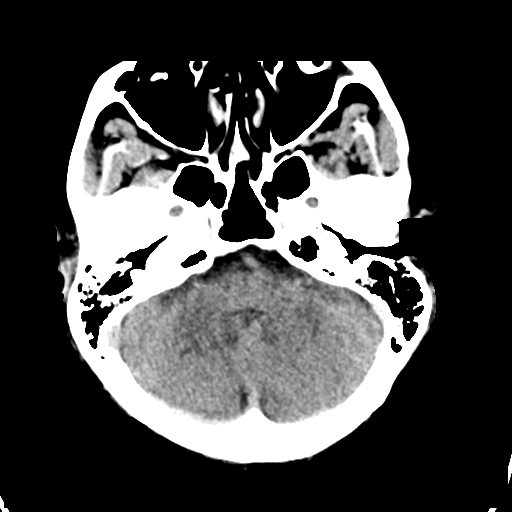
[im 13/35  brain]
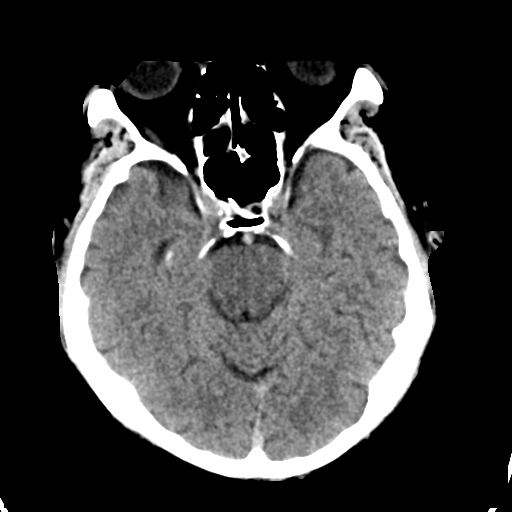
[im 18/35  brain]
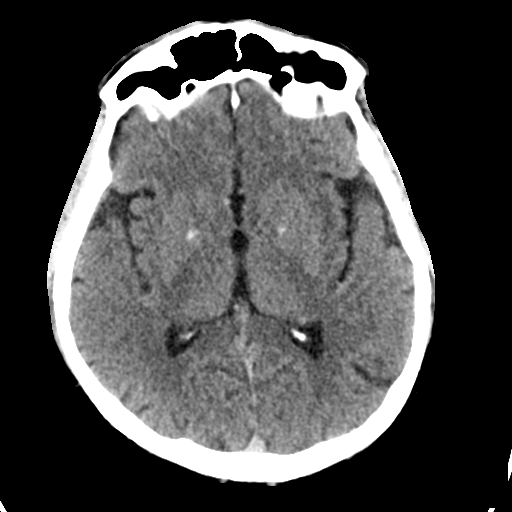
[im 22/35  brain]
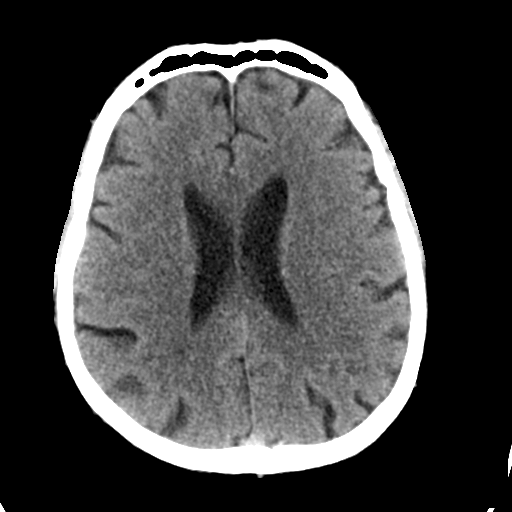
[im 22/35  bone]
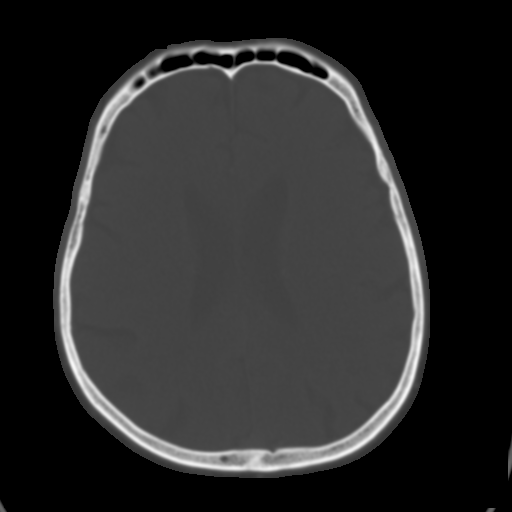
[im 26/35  brain]
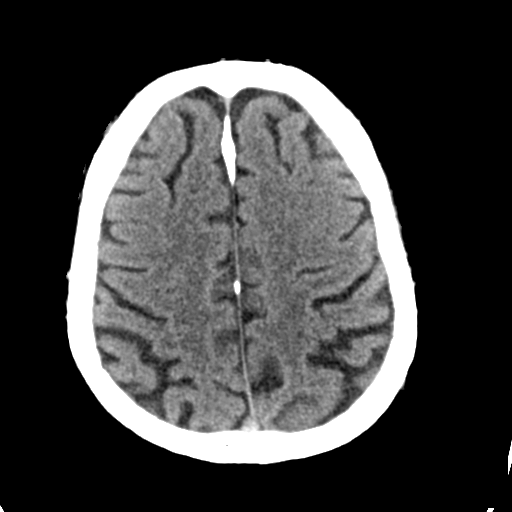
[im 30/35  brain]
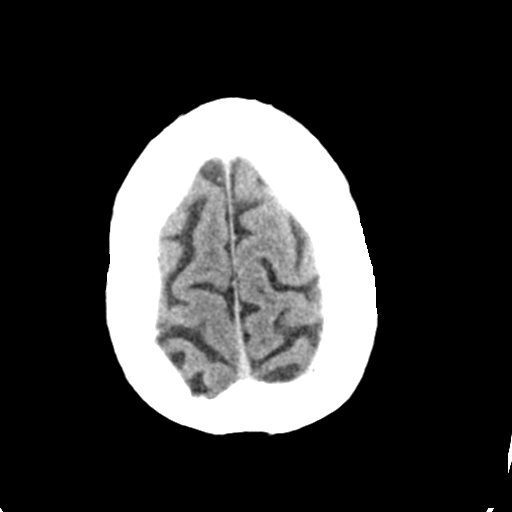

[Series 3: head bone · axial · 0.41mm/px · z∈[-141,-107]mm · 3 of 86 slices shown]
[im 9/86  bone]
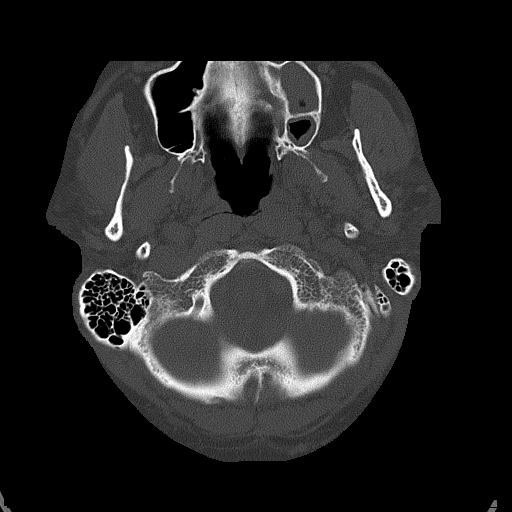
[im 18/86  bone]
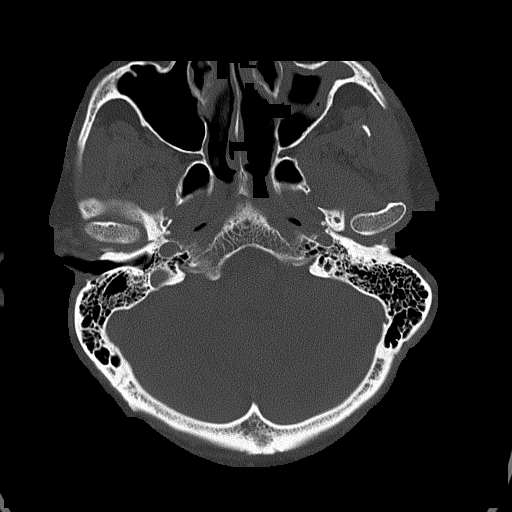
[im 26/86  bone]
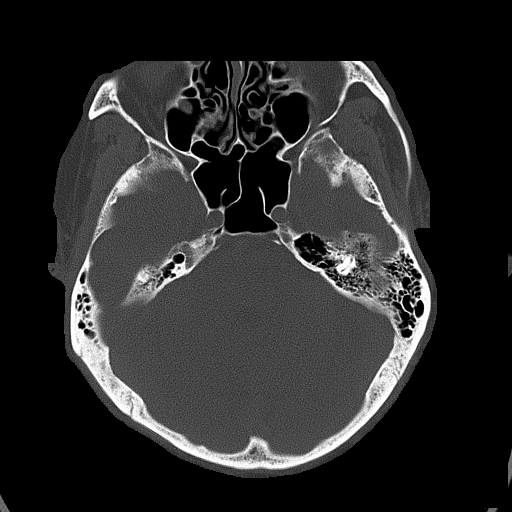

[Series 4: head without cor · coronal · non-contrast · 0.33mm/px · 3 of 66 slices shown]
[im 22/66  brain]
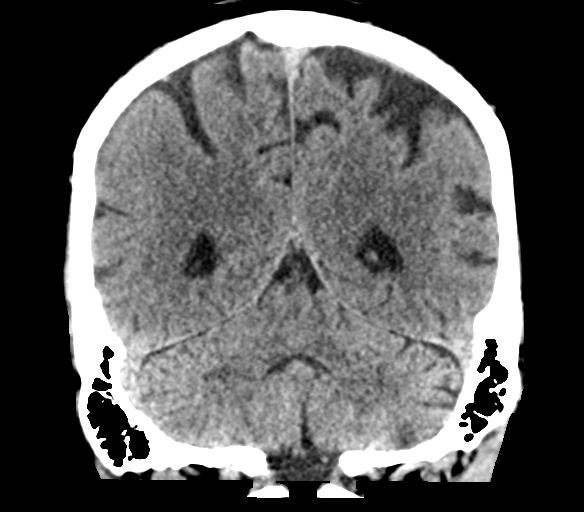
[im 29/66  brain]
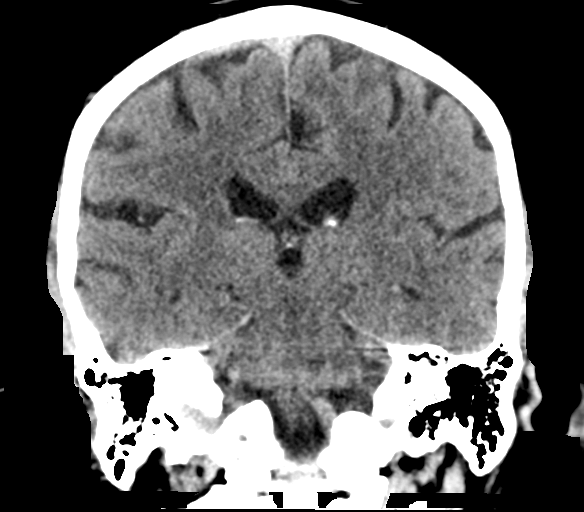
[im 37/66  brain]
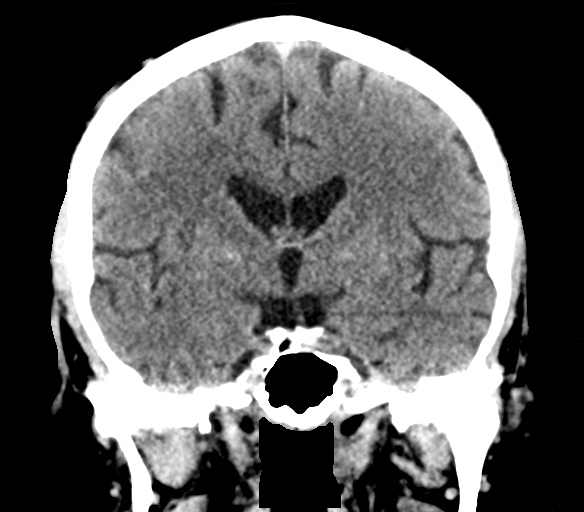

[Series 5: head without sag · sagittal · non-contrast · 0.33mm/px · 3 of 65 slices shown]
[im 22/65  brain]
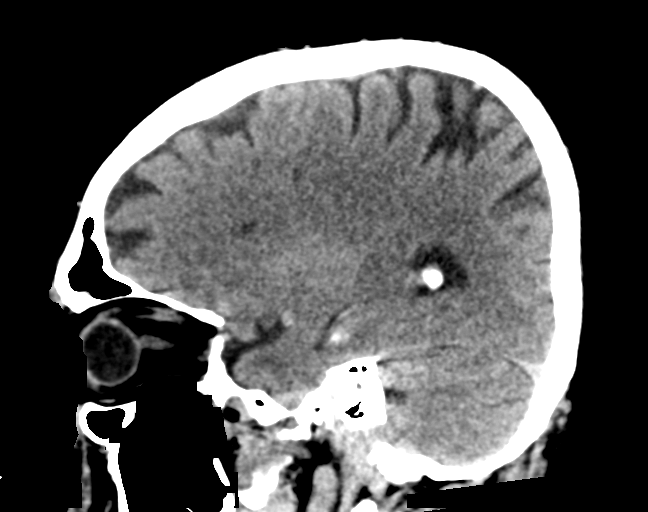
[im 33/65  brain]
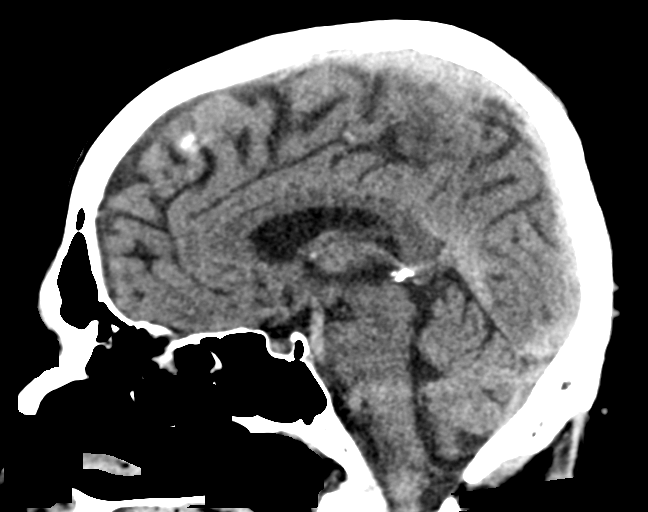
[im 43/65  brain]
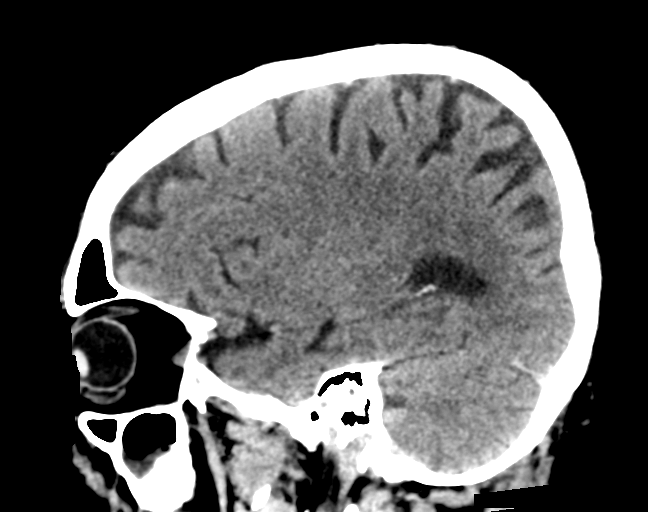

[16 of 47 positions shown; findings below may reference images not displayed]

FINDINGS: Brain: No evidence of acute infarction, hemorrhage, hydrocephalus,
extra-axial collection or mass lesion/mass effect. Mild
periventricular and deep white matter hypodensity.

Vascular: No hyperdense vessel or unexpected calcification.

Skull: Normal. Negative for fracture or focal lesion.

Sinuses/Orbits: Thickening of the left maxillary sinus mucosa. No
air-fluid level.

Other: None.
IMPRESSION: 1. No acute intracranial pathology. No non-contrast CT evidence of
acute stroke or hemorrhage. Consider MRI to more sensitively
evaluate for acute diffusion restricting infarction if clinically
suspected.

2.  Small-vessel white matter disease.

3. Thickening of the left maxillary sinus mucosa, without air-fluid
level. Correlate for signs and symptoms of sinusitis.

## 2022-01-16 NOTE — Progress Notes (Signed)
HISTORY AND PHYSICAL     CC:  follow up. Requesting Provider:  Rory Percy, MD  HPI: This is a 71 y.o. male here for follow up for carotid artery stenosis.  Pt is s/p left CEA for symptomatic carotid artery stenosis on 03/28/2021 by Dr. Donzetta Matters.  He had speech deficit that lasted less than 5 minutes as he could not get his words out.  He is right hand dominant.  He also had some left eye vision change with inability to grasp objects.    Pt was last seen 04/19/2021 and at that time he was doing well and his only complaint was a small area on the jaw with decreased sensation.    Pt returns today for follow up.    Pt denies any amaurosis fugax, speech difficulties, weakness, numbness, paralysis or clumsiness or facial droop.   He does not have any claudication sx and denies any non healing wounds.  He continues to smoke.   The pt is on a statin for cholesterol management.  The pt is on a daily aspirin.   Other AC:  Plavix The pt is on BB, diuretic for hypertension.   The pt is diabetic.   Tobacco hx:  current   Past Medical History:  Diagnosis Date   Arthritis    Asthma    Complication of anesthesia    slow to wake up   COPD (chronic obstructive pulmonary disease) (HCC)    Coronary artery disease    Diabetes (HCC)    type 2   Fatty liver    Headache    Hepatitis    fatty liver   Hyperlipidemia    Hypertension    Myocardial infarction ([place])    Restless legs    Stroke [facility])     Past Surgical History:  Procedure Laterality Date   COLONOSCOPY     age 8 [facility]   CORONARY ARTERY BYPASS GRAFT  2018   ENDARTERECTOMY Left 03/28/2021   Procedure: LEFT CAROTID ENDARTERECTOMY;  Surgeon: Waynetta Sandy, MD;  Location: [place];  Service: Vascular;  Laterality: Left;   heart bypass     HERNIA REPAIR     25 years ago   IR ANGIO INTRA EXTRACRAN SEL COM CAROTID INNOMINATE BILAT MOD SED  02/27/2021   IR ANGIO VERTEBRAL SEL SUBCLAVIAN INNOMINATE BILAT MOD SED   02/27/2021   IR RADIOLOGIST EVAL & MGMT  02/13/2021   IR RADIOLOGIST EVAL & MGMT  03/11/2021   IR US GUIDE VASC ACCESS RIGHT  02/27/2021   KNEE ARTHROSCOPY Left    PATCH ANGIOPLASTY Left 03/28/2021   Procedure: BOVINE PATCH ANGIOPLASTY;  Surgeon: Waynetta Sandy, MD;  Location: [place];  Service: Vascular;  Laterality: Left;   RADIOLOGY WITH ANESTHESIA N/A 02/27/2021   Procedure: STENT PLACEMENT;  Surgeon: Corrie Mckusick, DO;  Location: [place];  Service: Anesthesiology;  Laterality: N/A;   TONSILLECTOMY     As a child    Allergies  Allergen Reactions   Levonorgestrel-Ethinyl Estrad     Other reaction(s): Other (See Comments) Sneezing    Current Outpatient Medications  Medication Sig Dispense Refill   albuterol (VENTOLIN HFA) 108 (90 Base) MCG/ACT inhaler Inhale 2 puffs into the lungs every 6 (six) hours as needed for wheezing or shortness of breath.     aspirin EC 81 MG tablet Take 81 mg by mouth every morning.     budesonide-formoterol (SYMBICORT) 160-4.5 MCG/ACT inhaler Inhale 2 puffs into the lungs daily as needed (asthma).  clopidogrel (PLAVIX) 75 MG tablet Take 75 mg by mouth daily.     cyclobenzaprine (FLEXERIL) 10 MG tablet Take 10 mg by mouth at bedtime.     empagliflozin (JARDIANCE) 25 MG TABS tablet Take 25 mg by mouth daily.     hydrochlorothiazide (HYDRODIURIL) 25 MG tablet Take 25 mg by mouth daily.     losartan (COZAAR) 25 MG tablet Take 25 mg by mouth daily.     metFORMIN (GLUCOPHAGE) 500 MG tablet Take 500 mg by mouth 4 (four) times daily -  with meals and at bedtime.     metoprolol tartrate (LOPRESSOR) 25 MG tablet Take 25 mg by mouth 2 (two) times daily.     oxyCODONE-acetaminophen (PERCOCET/ROXICET) 5-325 MG tablet Take 1-2 tablets by mouth every 4 (four) hours as needed for moderate pain. 20 tablet 0   OZEMPIC, 1 MG/DOSE, 4 MG/3ML SOPN Inject 1 mg into the muscle every Monday.     pramipexole (MIRAPEX) 0.75 MG tablet Take 0.75 mg by mouth at bedtime.      sildenafil (VIAGRA) 50 MG tablet Take 50 mg by mouth as needed for erectile dysfunction.     simvastatin (ZOCOR) 80 MG tablet Take 80 mg by mouth at bedtime.     tamsulosin (FLOMAX) 0.4 MG CAPS capsule Take 0.4 mg by mouth at bedtime.     testosterone cypionate (DEPOTESTOSTERONE CYPIONATE) 200 MG/ML injection Inject 100 mg into the muscle every 14 (fourteen) days.     No current facility-administered medications for this visit.    Family History  Problem Relation Age of Onset   Colon cancer Neg Hx    Esophageal cancer Neg Hx     Social History   Socioeconomic History   Marital status: Married    Spouse name: Not on file   Number of children: Not on file   Years of education: Not on file   Highest education level: Not on file  Occupational History   Not on file  Tobacco Use   Smoking status: Every Day    Packs/day: 0.50    Types: Cigarettes   Smokeless tobacco: Never  Vaping Use   Vaping Use: Never used  Substance and Sexual Activity   Alcohol use: Yes    Comment: 3x a week   Drug use: Not Currently   Sexual activity: Yes  Other Topics Concern   Not on file  Social History Narrative   Not on file   Social Determinants of Health   Financial Resource Strain: Not on file  Food Insecurity: Not on file  Transportation Needs: Not on file  Physical Activity: Not on file  Stress: Not on file  Social Connections: Not on file  Intimate Partner Violence: Not on file     REVIEW OF SYSTEMS:   [X]  denotes positive finding, [ ]  denotes negative finding Cardiac  Comments:  Chest pain or chest pressure:    Shortness of breath upon exertion:    Short of breath when lying flat:    Irregular heart rhythm:        Vascular    Pain in calf, thigh, or hip brought on by ambulation:    Pain in feet at night that wakes you up from your sleep:     Blood clot in your veins:    Leg swelling:         Pulmonary    Oxygen at home:    Productive cough:     Wheezing:  Neurologic    Sudden weakness in arms or legs:     Sudden numbness in arms or legs:     Sudden onset of difficulty speaking or slurred speech:    Temporary loss of vision in one eye:     Problems with dizziness:         Gastrointestinal    Blood in stool:     Vomited blood:         Genitourinary    Burning when urinating:     Blood in urine:        Psychiatric    Major depression:         Hematologic    Bleeding problems:    Problems with blood clotting too easily:        Skin    Rashes or ulcers:        Constitutional    Fever or chills:      PHYSICAL EXAMINATION:  Today's Vitals   01/22/22 1037 01/22/22 1038  BP: 135/80 121/78  Pulse: 82 80  Temp: (!) 96.5 F (35.8 C)   Weight: 211 lb 1.6 oz (95.8 kg)   Height: 6' (1.829 m)    Body mass index is 28.63 kg/m.   General:  WDWN in NAD; vital signs documented above Gait: Not observed HENT: WNL, normocephalic Pulmonary: normal non-labored breathing Cardiac: regular HR, without carotid bruits Abdomen: soft, NT Skin: well healed left CEA scar Vascular Exam/Pulses:  Right Left  Radial 2+ (normal) 2+ (normal)   Extremities: without ischemic changes, without Gangrene , without cellulitis; without open wounds Musculoskeletal: no muscle wasting or atrophy  Neurologic: A&O X 3; moving all extremities equally; speech is fluent/normal Psychiatric:  The pt has Normal affect.   Non-Invasive Vascular Imaging:   Carotid Duplex on 01/22/2022: Right:  1-39% ICA stenosis Left:  normal Vertebrals:  Bilateral vertebral arteries demonstrate antegrade flow.  Subclavians: Normal flow hemodynamics were seen in bilateral subclavian arteries  Previous Carotid duplex on 02/04/2021 ([place]): Right: no hemodynamically significant stenosis Left:   70-99% ICA stenosis    ASSESSMENT/PLAN:: 71 y.o. male here for follow up carotid artery stenosis and is s/p left CEA for symptomatic carotid artery stenosis on 03/28/2021 by  Dr. Donzetta Matters.    -duplex today reveals 1-39% right ICA stenosis and normal on the left operative side. -discussed s/s of stroke with pt and he understands should he develop any of these sx, he will go to the nearest ER or call 911. -pt will f/u in one year with carotid duplex -pt will call sooner should they have any issues. -continue statin/asa/plavix -discussed importance of smoking cessation with pt.  -also discussed if he moves to [place] in the future, he would need f/u with Vascular surgeon there.   Leontine Locket, [facility] [phone]  Clinic MD:  Donzetta Matters

## 2022-01-22 ENCOUNTER — Other Ambulatory Visit: Payer: Self-pay

## 2022-01-22 ENCOUNTER — Encounter: Payer: Self-pay | Admitting: Physician Assistant

## 2022-01-22 ENCOUNTER — Ambulatory Visit (HOSPITAL_COMMUNITY)
Admission: RE | Admit: 2022-01-22 | Discharge: 2022-01-22 | Disposition: A | Discharge status: Home / Self Care | Payer: BC Managed Care – PPO | Source: Ambulatory Visit | Attending: Vascular Surgery | Admitting: Vascular Surgery

## 2022-01-22 ENCOUNTER — Ambulatory Visit: Payer: BC Managed Care – PPO | Admitting: Physician Assistant

## 2022-01-22 VITALS — BP 121/78 | HR 80 | Temp 96.5°F | Ht 72.0 in | Wt 211.1 lb

## 2022-01-22 DIAGNOSIS — I6523 Occlusion and stenosis of bilateral carotid arteries: Secondary | ICD-10-CM | POA: Diagnosis not present

## 2022-02-24 DIAGNOSIS — Z13220 Encounter for screening for lipoid disorders: Secondary | ICD-10-CM | POA: Diagnosis not present

## 2022-02-24 DIAGNOSIS — E119 Type 2 diabetes mellitus without complications: Secondary | ICD-10-CM | POA: Diagnosis not present

## 2022-02-24 DIAGNOSIS — I10 Essential (primary) hypertension: Secondary | ICD-10-CM | POA: Diagnosis not present

## 2022-02-24 DIAGNOSIS — E7800 Pure hypercholesterolemia, unspecified: Secondary | ICD-10-CM | POA: Diagnosis not present

## 2022-03-03 DIAGNOSIS — E291 Testicular hypofunction: Secondary | ICD-10-CM | POA: Diagnosis not present

## 2022-03-03 DIAGNOSIS — E1165 Type 2 diabetes mellitus with hyperglycemia: Secondary | ICD-10-CM | POA: Diagnosis not present

## 2022-03-03 DIAGNOSIS — F17210 Nicotine dependence, cigarettes, uncomplicated: Secondary | ICD-10-CM | POA: Diagnosis not present

## 2022-03-03 DIAGNOSIS — R5383 Other fatigue: Secondary | ICD-10-CM | POA: Diagnosis not present

## 2022-03-03 DIAGNOSIS — E7800 Pure hypercholesterolemia, unspecified: Secondary | ICD-10-CM | POA: Diagnosis not present

## 2022-03-03 DIAGNOSIS — Z720 Tobacco use: Secondary | ICD-10-CM | POA: Diagnosis not present

## 2022-03-03 DIAGNOSIS — I25810 Atherosclerosis of coronary artery bypass graft(s) without angina pectoris: Secondary | ICD-10-CM | POA: Diagnosis not present

## 2022-03-03 DIAGNOSIS — I10 Essential (primary) hypertension: Secondary | ICD-10-CM | POA: Diagnosis not present

## 2022-03-03 DIAGNOSIS — E7801 Familial hypercholesterolemia: Secondary | ICD-10-CM | POA: Diagnosis not present

## 2022-05-23 DIAGNOSIS — Z6827 Body mass index (BMI) 27.0-27.9, adult: Secondary | ICD-10-CM | POA: Diagnosis not present

## 2022-05-23 DIAGNOSIS — J329 Chronic sinusitis, unspecified: Secondary | ICD-10-CM | POA: Diagnosis not present

## 2022-06-30 DIAGNOSIS — E559 Vitamin D deficiency, unspecified: Secondary | ICD-10-CM | POA: Diagnosis not present

## 2022-06-30 DIAGNOSIS — E1165 Type 2 diabetes mellitus with hyperglycemia: Secondary | ICD-10-CM | POA: Diagnosis not present

## 2022-06-30 DIAGNOSIS — R739 Hyperglycemia, unspecified: Secondary | ICD-10-CM | POA: Diagnosis not present

## 2022-06-30 DIAGNOSIS — J449 Chronic obstructive pulmonary disease, unspecified: Secondary | ICD-10-CM | POA: Diagnosis not present

## 2022-06-30 DIAGNOSIS — Z13220 Encounter for screening for lipoid disorders: Secondary | ICD-10-CM | POA: Diagnosis not present

## 2022-06-30 DIAGNOSIS — E673 Hypervitaminosis D: Secondary | ICD-10-CM | POA: Diagnosis not present

## 2022-06-30 DIAGNOSIS — I10 Essential (primary) hypertension: Secondary | ICD-10-CM | POA: Diagnosis not present

## 2022-06-30 DIAGNOSIS — E781 Pure hyperglyceridemia: Secondary | ICD-10-CM | POA: Diagnosis not present

## 2022-06-30 DIAGNOSIS — E7800 Pure hypercholesterolemia, unspecified: Secondary | ICD-10-CM | POA: Diagnosis not present

## 2022-06-30 DIAGNOSIS — E291 Testicular hypofunction: Secondary | ICD-10-CM | POA: Diagnosis not present

## 2022-07-07 DIAGNOSIS — Z0001 Encounter for general adult medical examination with abnormal findings: Secondary | ICD-10-CM | POA: Diagnosis not present

## 2022-10-06 DIAGNOSIS — D751 Secondary polycythemia: Secondary | ICD-10-CM | POA: Diagnosis not present

## 2022-12-03 DIAGNOSIS — R509 Fever, unspecified: Secondary | ICD-10-CM | POA: Diagnosis not present

## 2022-12-03 DIAGNOSIS — Z20828 Contact with and (suspected) exposure to other viral communicable diseases: Secondary | ICD-10-CM | POA: Diagnosis not present

## 2022-12-03 DIAGNOSIS — Z6827 Body mass index (BMI) 27.0-27.9, adult: Secondary | ICD-10-CM | POA: Diagnosis not present

## 2022-12-03 DIAGNOSIS — R059 Cough, unspecified: Secondary | ICD-10-CM | POA: Diagnosis not present

## 2022-12-08 DIAGNOSIS — Z6826 Body mass index (BMI) 26.0-26.9, adult: Secondary | ICD-10-CM | POA: Diagnosis not present

## 2022-12-08 DIAGNOSIS — U099 Post covid-19 condition, unspecified: Secondary | ICD-10-CM | POA: Diagnosis not present

## 2022-12-08 DIAGNOSIS — J441 Chronic obstructive pulmonary disease with (acute) exacerbation: Secondary | ICD-10-CM | POA: Diagnosis not present

## 2022-12-29 DIAGNOSIS — J449 Chronic obstructive pulmonary disease, unspecified: Secondary | ICD-10-CM | POA: Diagnosis not present

## 2022-12-29 DIAGNOSIS — E291 Testicular hypofunction: Secondary | ICD-10-CM | POA: Diagnosis not present

## 2022-12-29 DIAGNOSIS — E1165 Type 2 diabetes mellitus with hyperglycemia: Secondary | ICD-10-CM | POA: Diagnosis not present

## 2022-12-29 DIAGNOSIS — I10 Essential (primary) hypertension: Secondary | ICD-10-CM | POA: Diagnosis not present

## 2023-01-05 DIAGNOSIS — G5603 Carpal tunnel syndrome, bilateral upper limbs: Secondary | ICD-10-CM | POA: Diagnosis not present

## 2023-01-05 DIAGNOSIS — J449 Chronic obstructive pulmonary disease, unspecified: Secondary | ICD-10-CM | POA: Diagnosis not present

## 2023-01-05 DIAGNOSIS — E1165 Type 2 diabetes mellitus with hyperglycemia: Secondary | ICD-10-CM | POA: Diagnosis not present

## 2023-01-05 DIAGNOSIS — I10 Essential (primary) hypertension: Secondary | ICD-10-CM | POA: Diagnosis not present

## 2023-02-28 DIAGNOSIS — J0191 Acute recurrent sinusitis, unspecified: Secondary | ICD-10-CM | POA: Diagnosis not present

## 2023-02-28 DIAGNOSIS — S01119A Laceration without foreign body of unspecified eyelid and periocular area, initial encounter: Secondary | ICD-10-CM | POA: Diagnosis not present

## 2023-02-28 DIAGNOSIS — W06XXXA Fall from bed, initial encounter: Secondary | ICD-10-CM | POA: Diagnosis not present

## 2023-02-28 DIAGNOSIS — R059 Cough, unspecified: Secondary | ICD-10-CM | POA: Diagnosis not present

## 2023-03-02 DIAGNOSIS — D751 Secondary polycythemia: Secondary | ICD-10-CM | POA: Diagnosis not present

## 2023-04-28 NOTE — Progress Notes (Deleted)
Office Visit Note  Patient: Jeffrey Matthews             Date of Birth: 11-02-1951           MRN: [id number]             PCP: Encarnacion Slates, PA-C Referring: Dion Saucier, PA-C Visit Date: 05/12/2023 Occupation: @GUAROCC @  Subjective:  No chief complaint on file.   History of Present Illness: Jeffrey Matthews is a 72 y.o. male ***     Activities of Daily Living:  Patient reports morning stiffness for *** {minute/hour:19697}.   Patient {ACTIONS;DENIES/REPORTS:21021675::"Denies"} nocturnal pain.  Difficulty dressing/grooming: {ACTIONS;DENIES/REPORTS:21021675::"Denies"} Difficulty climbing stairs: {ACTIONS;DENIES/REPORTS:21021675::"Denies"} Difficulty getting out of chair: {ACTIONS;DENIES/REPORTS:21021675::"Denies"} Difficulty using hands for taps, buttons, cutlery, and/or writing: {ACTIONS;DENIES/REPORTS:21021675::"Denies"}  No Rheumatology ROS completed.   PMFS History:  Patient Active Problem List   Diagnosis Date Noted   Carotid artery stenosis 03/28/2021   Chronic ischemic heart disease 03/27/2021   H/O non-ST elevation myocardial infarction (NSTEMI) 03/27/2021   History of heart bypass surgery 03/27/2021   Left carotid stenosis 02/28/2021   Tobacco dependence due to cigarettes 02/12/2021   Elevated LFTs 05/11/2020   Diarrhea 05/11/2020    Past Medical History:  Diagnosis Date   Arthritis    Asthma    Complication of anesthesia    slow to wake up   COPD (chronic obstructive pulmonary disease) (HCC)    Coronary artery disease    Diabetes (HCC)    type 2   Fatty liver    Headache    Hepatitis    fatty liver   Hyperlipidemia    Hypertension    Myocardial infarction (HCC)    Restless legs    Stroke (HCC)     Family History  Problem Relation Age of Onset   Colon cancer Neg Hx    Esophageal cancer Neg Hx    Past Surgical History:  Procedure Laterality Date   COLONOSCOPY     age 89 [facility]   CORONARY ARTERY BYPASS GRAFT  2018    ENDARTERECTOMY Left 03/28/2021   Procedure: LEFT CAROTID ENDARTERECTOMY;  Surgeon: Maeola Harman, MD;  Location: [facility] OR;  Service: Vascular;  Laterality: Left;   heart bypass     HERNIA REPAIR     25 years ago   IR ANGIO INTRA EXTRACRAN SEL COM CAROTID INNOMINATE BILAT MOD SED  02/27/2021   IR ANGIO VERTEBRAL SEL SUBCLAVIAN INNOMINATE BILAT MOD SED  02/27/2021   IR RADIOLOGIST EVAL & MGMT  02/13/2021   IR RADIOLOGIST EVAL & MGMT  03/11/2021   IR US GUIDE VASC ACCESS RIGHT  02/27/2021   KNEE ARTHROSCOPY Left    PATCH ANGIOPLASTY Left 03/28/2021   Procedure: BOVINE PATCH ANGIOPLASTY;  Surgeon: Maeola Harman, MD;  Location: [facility] OR;  Service: Vascular;  Laterality: Left;   RADIOLOGY WITH ANESTHESIA N/A 02/27/2021   Procedure: STENT PLACEMENT;  Surgeon: Gilmer Mor, DO;  Location: [facility] OR;  Service: Anesthesiology;  Laterality: N/A;   TONSILLECTOMY     As a child   Social History   Social History Narrative   Not on file   Immunization History  Administered Date(s) Administered   Tdap 01/03/2009     Objective: Vital Signs: There were no vitals taken for this visit.   Physical Exam   Musculoskeletal Exam: ***  CDAI Exam: CDAI Score: -- Patient Global: --; Provider Global: -- Swollen: --; Tender: -- Joint Exam 05/12/2023   No joint exam has been  documented for this visit   There is currently no information documented on the homunculus. Go to the Rheumatology activity and complete the homunculus joint exam.  Investigation: No additional findings.  Imaging: No results found.  Recent Labs: Lab Results  Component Value Date   WBC 8.4 03/29/2021   HGB 14.7 03/29/2021   PLT 109 (L) 03/29/2021   NA 135 03/29/2021   K 4.0 03/29/2021   CL 103 03/29/2021   CO2 24 03/29/2021   GLUCOSE 158 (H) 03/29/2021   BUN 15 03/29/2021   CREATININE 0.91 03/29/2021   BILITOT 0.7 03/26/2021   ALKPHOS 93 03/26/2021   AST 32 03/26/2021   ALT 29 03/26/2021   PROT 6.6  03/26/2021   ALBUMIN 4.3 03/26/2021   CALCIUM 8.5 (L) 03/29/2021    Speciality Comments: No specialty comments available.  Procedures:  No procedures performed Allergies: Levonorgestrel-ethinyl estrad   Assessment / Plan:     Visit Diagnoses: Myalgia  Joint swelling  Bilateral carpal tunnel syndrome  Elevated LFTs  History of COPD  Left carotid stenosis  Chronic ischemic heart disease  Tobacco dependence due to cigarettes  History of heart bypass surgery  H/O non-ST elevation myocardial infarction (NSTEMI)  History of type 2 diabetes mellitus  Pure hypercholesterolemia  History of BPH  Essential hypertension  Orders: No orders of the defined types were placed in this encounter.  No orders of the defined types were placed in this encounter.   Face-to-face time spent with patient was *** minutes. Greater than 50% of time was spent in counseling and coordination of care.  Follow-Up Instructions: No follow-ups on file.   Gearldine Bienenstock, PA-C  Note - This record has been created using Dragon software.  Chart creation errors have been sought, but may not always  have been located. Such creation errors do not reflect on  the standard of medical care.

## 2023-05-07 ENCOUNTER — Encounter: Payer: BC Managed Care – PPO | Admitting: Rheumatology

## 2023-05-12 ENCOUNTER — Encounter: Payer: BC Managed Care – PPO | Admitting: Rheumatology

## 2023-05-12 DIAGNOSIS — Z951 Presence of aortocoronary bypass graft: Secondary | ICD-10-CM

## 2023-05-12 DIAGNOSIS — Z8639 Personal history of other endocrine, nutritional and metabolic disease: Secondary | ICD-10-CM

## 2023-05-12 DIAGNOSIS — M791 Myalgia, unspecified site: Secondary | ICD-10-CM

## 2023-05-12 DIAGNOSIS — R7989 Other specified abnormal findings of blood chemistry: Secondary | ICD-10-CM

## 2023-05-12 DIAGNOSIS — G5603 Carpal tunnel syndrome, bilateral upper limbs: Secondary | ICD-10-CM

## 2023-05-12 DIAGNOSIS — I259 Chronic ischemic heart disease, unspecified: Secondary | ICD-10-CM

## 2023-05-12 DIAGNOSIS — Z8709 Personal history of other diseases of the respiratory system: Secondary | ICD-10-CM

## 2023-05-12 DIAGNOSIS — I252 Old myocardial infarction: Secondary | ICD-10-CM

## 2023-05-12 DIAGNOSIS — E7800 Pure hypercholesterolemia, unspecified: Secondary | ICD-10-CM

## 2023-05-12 DIAGNOSIS — I6522 Occlusion and stenosis of left carotid artery: Secondary | ICD-10-CM

## 2023-05-12 DIAGNOSIS — F17210 Nicotine dependence, cigarettes, uncomplicated: Secondary | ICD-10-CM

## 2023-05-12 DIAGNOSIS — Z87438 Personal history of other diseases of male genital organs: Secondary | ICD-10-CM

## 2023-05-12 DIAGNOSIS — I10 Essential (primary) hypertension: Secondary | ICD-10-CM

## 2023-05-12 DIAGNOSIS — M2540 Effusion, unspecified joint: Secondary | ICD-10-CM

## 2023-05-29 ENCOUNTER — Ambulatory Visit: Payer: BC Managed Care – PPO | Admitting: Rheumatology

## 2023-06-01 NOTE — Progress Notes (Unsigned)
Office Visit Note  Patient: Jeffrey Matthews             Date of Birth: 08-15-1951           MRN: [id number]             PCP: Encarnacion Slates, PA-C Referring: Dion Saucier, PA-C Visit Date: 06/15/2023 Occupation: @GUAROCC @  Subjective:  Pain in both hands    History of Present Illness: Jeffrey Matthews is a 72 y.o. male who presents today for a new patient consultation.  Patient presents today with bilateral hand pain and paresthesias.  Patient states that the symptoms started over 6 months ago and are more severe in his right hand and left hand.  He is right-hand dominant.  He performs repetitive and overuse activities at work which likely exacerbated his symptoms.  Patient reports that he has been diagnosed with carpal tunnel syndrome by his PCP but did not undergo NCV with EMG.  He has not yet tried wearing carpal tunnel braces.  He experiences stiffness and difficulty making a complete fist at times.  He has been taking Tylenol 2 tablets twice daily for pain relief along with a baby aspirin daily.  Patient states that the paresthesias in his hands are what interfere with his quality of life most significantly.  Patient states in the past he has been diagnosed with frozen shoulder bilaterally.  He states that he continues to have some stiffness and discomfort with overhead activities.  Patient states that he had arthroscopic surgery performed on the left knee over 20 years ago.  He states that he continues to experience a popping and clicking intermittently.  He experiences stiffness after sitting for prolonged periods of time.  He denies any swelling in the left knee joint. Patient denies any history of psoriasis, uveitis, Crohn's disease, or ulcerative colitis.  Patient states that his son was previously diagnosed with gout but denies any other family history of any rheumatologic conditions.   Activities of Daily Living:  Patient reports morning stiffness for 15-20 minutes.   Patient Denies  nocturnal pain.  Difficulty dressing/grooming: Denies Difficulty climbing stairs: Denies Difficulty getting out of chair: Denies Difficulty using hands for taps, buttons, cutlery, and/or writing: Reports  Review of Systems  Constitutional:  Positive for fatigue.  HENT:  Positive for mouth dryness. Negative for mouth sores.   Eyes:  Negative for dryness.  Respiratory:  Positive for cough, shortness of breath and wheezing.   Cardiovascular:  Negative for chest pain and palpitations.  Gastrointestinal:  Negative for blood in stool, constipation and diarrhea.  Endocrine: Negative for increased urination.  Genitourinary:  Negative for involuntary urination.  Musculoskeletal:  Positive for joint pain, gait problem, joint pain, joint swelling and morning stiffness. Negative for myalgias, muscle weakness, muscle tenderness and myalgias.  Skin:  Negative for color change, rash, hair loss and sensitivity to sunlight.  Allergic/Immunologic: Negative for susceptible to infections.  Neurological:  Positive for numbness and parasthesias. Negative for dizziness and headaches.  Hematological:  Positive for bruising/bleeding tendency. Negative for swollen glands.  Psychiatric/Behavioral:  Positive for depressed mood. Negative for sleep disturbance. The patient is not nervous/anxious.     PMFS History:  Patient Active Problem List   Diagnosis Date Noted   Carotid artery stenosis 03/28/2021   Chronic ischemic heart disease 03/27/2021   H/O non-ST elevation myocardial infarction (NSTEMI) 03/27/2021   History of heart bypass surgery 03/27/2021   Left carotid stenosis 02/28/2021   Tobacco dependence  due to cigarettes 02/12/2021   Elevated LFTs 05/11/2020   Diarrhea 05/11/2020    Past Medical History:  Diagnosis Date   Arthritis    Asthma    Complication of anesthesia    slow to wake up   COPD (chronic obstructive pulmonary disease) (HCC)    Coronary artery disease    Diabetes (HCC)    type 2    Fatty liver    Headache    Hepatitis    fatty liver   Hyperlipidemia    Hypertension    Myocardial infarction (HCC)    Restless legs    Stroke (HCC)     Family History  Problem Relation Age of Onset   Dementia Mother    Diabetes Mother    Aneurysm Father    Diabetes Sister        history of   Heart disease Sister    Other Sister        kidney and pancreas transplant   Hypertension Son    Kidney Stones Son    Kidney Stones Son    Gout Son    Colon cancer Neg Hx    Esophageal cancer Neg Hx    Past Surgical History:  Procedure Laterality Date   COLONOSCOPY     age 63 [facility]   CORONARY ARTERY BYPASS GRAFT  2018   ENDARTERECTOMY Left 03/28/2021   Procedure: LEFT CAROTID ENDARTERECTOMY;  Surgeon: Maeola Harman, MD;  Location: [facility] OR;  Service: Vascular;  Laterality: Left;   heart bypass     HERNIA REPAIR     25 years ago   IR ANGIO INTRA EXTRACRAN SEL COM CAROTID INNOMINATE BILAT MOD SED  02/27/2021   IR ANGIO VERTEBRAL SEL SUBCLAVIAN INNOMINATE BILAT MOD SED  02/27/2021   IR RADIOLOGIST EVAL & MGMT  02/13/2021   IR RADIOLOGIST EVAL & MGMT  03/11/2021   IR US GUIDE VASC ACCESS RIGHT  02/27/2021   KNEE ARTHROSCOPY Left    PATCH ANGIOPLASTY Left 03/28/2021   Procedure: BOVINE PATCH ANGIOPLASTY;  Surgeon: Maeola Harman, MD;  Location: [facility] OR;  Service: Vascular;  Laterality: Left;   RADIOLOGY WITH ANESTHESIA N/A 02/27/2021   Procedure: STENT PLACEMENT;  Surgeon: Gilmer Mor, DO;  Location: [facility] OR;  Service: Anesthesiology;  Laterality: N/A;   TONSILLECTOMY     As a child   Social History   Social History Narrative   Not on file   Immunization History  Administered Date(s) Administered   Tdap 01/03/2009     Objective: Vital Signs: BP 129/80 (BP Location: Right Arm, Patient Position: Sitting, Cuff Size: Normal)   Pulse 72   Resp 18   Ht 6\' 1"  (1.854 m)   Wt 200 lb 3.2 oz (90.8 kg)   BMI 26.41 kg/m    Physical Exam Vitals and  nursing note reviewed.  Constitutional:      Appearance: He is well-developed.  HENT:     Head: Normocephalic and atraumatic.  Eyes:     Conjunctiva/sclera: Conjunctivae normal.     Pupils: Pupils are equal, round, and reactive to light.  Cardiovascular:     Rate and Rhythm: Normal rate and regular rhythm.     Heart sounds: Normal heart sounds.  Pulmonary:     Effort: Pulmonary effort is normal.     Breath sounds: Normal breath sounds.  Abdominal:     General: Bowel sounds are normal.     Palpations: Abdomen is soft.  Musculoskeletal:     Cervical  back: Normal range of motion and neck supple.  Skin:    General: Skin is warm and dry.     Capillary Refill: Capillary refill takes less than 2 seconds.  Neurological:     Mental Status: He is alert and oriented to person, place, and time.  Psychiatric:        Behavior: Behavior normal.      Musculoskeletal Exam: C-spine has slightly limited range of motion with lateral rotation.  No midline spinal tenderness or SI joint tenderness.  Shoulder joints have good range of motion with some discomfort with full abduction.  Elbow joints and wrist joints have good range of motion with no tenderness.  Tenderness over the right second MCP joint.  PIP and DIP prominence consistent with osteoarthritis of both hands.  Complete fist formation bilaterally.  Hip joints have good range of motion with no groin pain.  Knee joints have good range of motion with no warmth or effusion.  Crepitus noted in the left knee.  Ankle joints have good range of motion with no tenderness or synovitis.  Dorsal spurring noted bilaterally.  Thickening and prominence of bilateral first MTP joints.  No tenderness or synovitis over MTP joints.  CDAI Exam: CDAI Score: -- Patient Global: --; Provider Global: -- Swollen: --; Tender: -- Joint Exam 06/15/2023   No joint exam has been documented for this visit   There is currently no information documented on the homunculus. Go  to the Rheumatology activity and complete the homunculus joint exam.  Investigation: No additional findings.  Imaging: No results found.  Recent Labs: Lab Results  Component Value Date   WBC 8.4 03/29/2021   HGB 14.7 03/29/2021   PLT 109 (L) 03/29/2021   NA 135 03/29/2021   K 4.0 03/29/2021   CL 103 03/29/2021   CO2 24 03/29/2021   GLUCOSE 158 (H) 03/29/2021   BUN 15 03/29/2021   CREATININE 0.91 03/29/2021   BILITOT 0.7 03/26/2021   ALKPHOS 93 03/26/2021   AST 32 03/26/2021   ALT 29 03/26/2021   PROT 6.6 03/26/2021   ALBUMIN 4.3 03/26/2021   CALCIUM 8.5 (L) 03/29/2021    Speciality Comments: No specialty comments available.  Procedures:  No procedures performed Allergies: Patient has no active allergies.   Assessment / Plan:     Visit Diagnoses: Pain in both hands -Patient presents today for further evaluation of bilateral hand pain times 6+ months.  He has been experiencing intermittent paresthesias most prominent in the right hand.  Diagnosed with carpal tunnel syndrome by his PCP--has not undergone NCV with EMG for confirmation.  He performs repetitive and overuse activities at work which likely exacerbated his symptoms.  At times he has difficulty making a complete fist and has stiffness bilaterally.  His wife has noticed visible swelling in his hands at times.  He has been taking Tylenol 2 tablets twice daily and a baby aspirin daily for symptomatic relief.  He has not noticed any nocturnal pain in his hands.  His morning stiffness lasts about 15 to 20 minutes daily. His son was previously diagnosed with gout but he denies any other family history of any rheumatologic conditions. In regards to the paresthesias discussed scheduling an NCV with EMG.  He would like to first try wearing carpal tunnel braces at night. He has PIP and DIP thickening consistent with osteoarthritis of both hands.  Tenderness over the right second MCP joint.  Some synovial thickening over MCP  joints but no obvious synovitis noted.  X-rays of both hands were obtained today along with the following lab work for further evaluation.  Results will be discussed at the new patient follow-up visit.  plan: XR Hand 2 View Right, XR Hand 2 View Left, Rheumatoid factor, Cyclic citrul peptide antibody, IgG, Uric acid, Sedimentation rate, C-reactive protein, CK  Chronic pain of left knee -Previously underwent arthroscopic surgery.  No longer followed by orthopedics.  No recent injury or fall.  He experiences a popping sensation in the left knee joint as well as stiffness and a clicking after sitting for prolonged periods of time.  On examination he has good range of motion of the left knee joint with crepitus.  No warmth or effusion noted.  X-rays of the left knee updated today.   Plan: XR KNEE 3 VIEW LEFT  Medication monitoring encounter -Patient is taking tylenol 2 tablets twice daily and a baby aspirin daily.  CBC and CMP will be obtained today.  Plan: CBC with Differential/Platelet, COMPLETE METABOLIC PANEL WITH GFR  Muscle ache - He has no muscular weakness at this time.  He experiences intermittent myalgias and joint stiffness.  CK will be obtained today.- Plan: CK  Bilateral carpal tunnel syndrome: Not confirmed by NCV with EMG.  Patient is experiencing paresthesias in both hands, right hand greater than left.  He performs repetitive and overuse activities at work which likely exacerbate his symptoms.  Patient was encouraged to try wearing carpal tunnel braces at night.  S/P arthroscopic surgery of left knee: 20+ years ago.  Previously had a cortisone injection in the left knee 20+ years ago.  Crepitus noted in the left knee.  No warmth or effusion noted.  X-rays of the left knee were updated today.  Other medical conditions are listed as follows:  Elevated LFTs: Patient takes Tylenol 2 tablets twice daily.  CMP will be updated today.  Essential hypertension: Blood pressure was 129/80 today in  the office.  History of COPD  Chronic ischemic heart disease  Pure hypercholesterolemia  History of type 2 diabetes mellitus  Left carotid stenosis  Tobacco dependence due to cigarettes  History of heart bypass surgery  H/O non-ST elevation myocardial infarction (NSTEMI)  RLS (restless legs syndrome)    Orders: Orders Placed This Encounter  Procedures   XR KNEE 3 VIEW LEFT   XR Hand 2 View Right   XR Hand 2 View Left   Rheumatoid factor   Cyclic citrul peptide antibody, IgG   Uric acid   Sedimentation rate   C-reactive protein   CK   CBC with Differential/Platelet   COMPLETE METABOLIC PANEL WITH GFR   No orders of the defined types were placed in this encounter.  Follow-Up Instructions: Return for NPFU.   Gearldine Bienenstock, PA-C  Note - This record has been created using Dragon software.  Chart creation errors have been sought, but may not always  have been located. Such creation errors do not reflect on  the standard of medical care.

## 2023-06-15 ENCOUNTER — Ambulatory Visit: Payer: BC Managed Care – PPO | Attending: Rheumatology | Admitting: Physician Assistant

## 2023-06-15 ENCOUNTER — Ambulatory Visit: Payer: BC Managed Care – PPO

## 2023-06-15 ENCOUNTER — Ambulatory Visit (INDEPENDENT_AMBULATORY_CARE_PROVIDER_SITE_OTHER): Payer: BC Managed Care – PPO

## 2023-06-15 ENCOUNTER — Encounter: Payer: Self-pay | Admitting: Physician Assistant

## 2023-06-15 VITALS — BP 129/80 | HR 72 | Resp 18 | Ht 73.0 in | Wt 200.2 lb

## 2023-06-15 DIAGNOSIS — Z9889 Other specified postprocedural states: Secondary | ICD-10-CM

## 2023-06-15 DIAGNOSIS — M79642 Pain in left hand: Secondary | ICD-10-CM

## 2023-06-15 DIAGNOSIS — G8929 Other chronic pain: Secondary | ICD-10-CM

## 2023-06-15 DIAGNOSIS — F17210 Nicotine dependence, cigarettes, uncomplicated: Secondary | ICD-10-CM

## 2023-06-15 DIAGNOSIS — Z5181 Encounter for therapeutic drug level monitoring: Secondary | ICD-10-CM | POA: Diagnosis not present

## 2023-06-15 DIAGNOSIS — I10 Essential (primary) hypertension: Secondary | ICD-10-CM

## 2023-06-15 DIAGNOSIS — R7989 Other specified abnormal findings of blood chemistry: Secondary | ICD-10-CM

## 2023-06-15 DIAGNOSIS — I6522 Occlusion and stenosis of left carotid artery: Secondary | ICD-10-CM

## 2023-06-15 DIAGNOSIS — M791 Myalgia, unspecified site: Secondary | ICD-10-CM | POA: Diagnosis not present

## 2023-06-15 DIAGNOSIS — M25562 Pain in left knee: Secondary | ICD-10-CM

## 2023-06-15 DIAGNOSIS — Z8639 Personal history of other endocrine, nutritional and metabolic disease: Secondary | ICD-10-CM

## 2023-06-15 DIAGNOSIS — E7800 Pure hypercholesterolemia, unspecified: Secondary | ICD-10-CM

## 2023-06-15 DIAGNOSIS — Z951 Presence of aortocoronary bypass graft: Secondary | ICD-10-CM

## 2023-06-15 DIAGNOSIS — Z8709 Personal history of other diseases of the respiratory system: Secondary | ICD-10-CM

## 2023-06-15 DIAGNOSIS — G5603 Carpal tunnel syndrome, bilateral upper limbs: Secondary | ICD-10-CM

## 2023-06-15 DIAGNOSIS — M79641 Pain in right hand: Secondary | ICD-10-CM | POA: Diagnosis not present

## 2023-06-15 DIAGNOSIS — G2581 Restless legs syndrome: Secondary | ICD-10-CM

## 2023-06-15 DIAGNOSIS — I252 Old myocardial infarction: Secondary | ICD-10-CM

## 2023-06-15 DIAGNOSIS — I259 Chronic ischemic heart disease, unspecified: Secondary | ICD-10-CM

## 2023-06-15 LAB — CBC WITH DIFFERENTIAL/PLATELET
Absolute Monocytes: 502 cells/uL (ref 200–950)
Basophils Absolute: 50 cells/uL (ref 0–200)
Basophils Relative: 0.8 %
Eosinophils Absolute: 446 cells/uL (ref 15–500)
Eosinophils Relative: 7.2 %
HCT: 51 % — ABNORMAL HIGH (ref 38.5–50.0)
Hemoglobin: 16.6 g/dL (ref 13.2–17.1)
Lymphs Abs: 1457 cells/uL (ref 850–3900)
MCH: 31 pg (ref 27.0–33.0)
MCHC: 32.5 g/dL (ref 32.0–36.0)
MCV: 95.3 fL (ref 80.0–100.0)
Monocytes Relative: 8.1 %
Neutro Abs: 3745 cells/uL (ref 1500–7800)
Neutrophils Relative %: 60.4 %
RBC: 5.35 10*6/uL (ref 4.20–5.80)
RDW: 13 % (ref 11.0–15.0)
Total Lymphocyte: 23.5 %
WBC: 6.2 10*3/uL (ref 3.8–10.8)

## 2023-06-16 NOTE — Progress Notes (Signed)
Results will be discussed at NPFU

## 2023-07-06 DIAGNOSIS — Z0000 Encounter for general adult medical examination without abnormal findings: Secondary | ICD-10-CM | POA: Diagnosis not present

## 2023-07-06 DIAGNOSIS — E7801 Familial hypercholesterolemia: Secondary | ICD-10-CM | POA: Diagnosis not present

## 2023-07-06 DIAGNOSIS — R739 Hyperglycemia, unspecified: Secondary | ICD-10-CM | POA: Diagnosis not present

## 2023-07-06 DIAGNOSIS — E1165 Type 2 diabetes mellitus with hyperglycemia: Secondary | ICD-10-CM | POA: Diagnosis not present

## 2023-07-06 DIAGNOSIS — E119 Type 2 diabetes mellitus without complications: Secondary | ICD-10-CM | POA: Diagnosis not present

## 2023-07-06 DIAGNOSIS — E7800 Pure hypercholesterolemia, unspecified: Secondary | ICD-10-CM | POA: Diagnosis not present

## 2023-07-06 DIAGNOSIS — Z1321 Encounter for screening for nutritional disorder: Secondary | ICD-10-CM | POA: Diagnosis not present

## 2023-07-06 DIAGNOSIS — I10 Essential (primary) hypertension: Secondary | ICD-10-CM | POA: Diagnosis not present

## 2023-07-06 NOTE — Progress Notes (Unsigned)
Office Visit Note  Patient: Jeffrey Matthews             Date of Birth: Jun 04, 1951           MRN: [id number]             PCP: Encarnacion Slates, PA-C Referring: Encarnacion Slates, PA-C Visit Date: 07/13/2023 Occupation: @GUAROCC @  Subjective:  Discuss results   History of Present Illness: Jeffrey Matthews is a 72 y.o. male with history of osteoarthritis.   Component     Latest Ref Rng 06/15/2023  RA Latex Turbid.     <14 IU/mL <10   Cyclic Citrullin Peptide Ab     UNITS <16   Uric Acid, Serum     4.0 - 8.0 mg/dL 6.0   Sed Rate     0 - 20 mm/h 2   CRP     <8.0 mg/L <3.0   CK Total     44 - 196 U/L 65      Activities of Daily Living:  Patient reports morning stiffness for *** {minute/hour:19697}.   Patient {ACTIONS;DENIES/REPORTS:21021675::"Denies"} nocturnal pain.  Difficulty dressing/grooming: {ACTIONS;DENIES/REPORTS:21021675::"Denies"} Difficulty climbing stairs: {ACTIONS;DENIES/REPORTS:21021675::"Denies"} Difficulty getting out of chair: {ACTIONS;DENIES/REPORTS:21021675::"Denies"} Difficulty using hands for taps, buttons, cutlery, and/or writing: {ACTIONS;DENIES/REPORTS:21021675::"Denies"}  No Rheumatology ROS completed.   PMFS History:  Patient Active Problem List   Diagnosis Date Noted   Carotid artery stenosis 03/28/2021   Chronic ischemic heart disease 03/27/2021   H/O non-ST elevation myocardial infarction (NSTEMI) 03/27/2021   History of heart bypass surgery 03/27/2021   Left carotid stenosis 02/28/2021   Tobacco dependence due to cigarettes 02/12/2021   Elevated LFTs 05/11/2020   Diarrhea 05/11/2020    Past Medical History:  Diagnosis Date   Arthritis    Asthma    Complication of anesthesia    slow to wake up   COPD (chronic obstructive pulmonary disease) (HCC)    Coronary artery disease    Diabetes (HCC)    type 2   Fatty liver    Headache    Hepatitis    fatty liver   Hyperlipidemia    Hypertension    Myocardial infarction (HCC)    Restless legs     Stroke (HCC)     Family History  Problem Relation Age of Onset   Dementia Mother    Diabetes Mother    Aneurysm Father    Diabetes Sister        history of   Heart disease Sister    Other Sister        kidney and pancreas transplant   Hypertension Son    Kidney Stones Son    Kidney Stones Son    Gout Son    Colon cancer Neg Hx    Esophageal cancer Neg Hx    Past Surgical History:  Procedure Laterality Date   COLONOSCOPY     age 68 [facility]   CORONARY ARTERY BYPASS GRAFT  2018   ENDARTERECTOMY Left 03/28/2021   Procedure: LEFT CAROTID ENDARTERECTOMY;  Surgeon: Maeola Harman, MD;  Location: [facility] OR;  Service: Vascular;  Laterality: Left;   heart bypass     HERNIA REPAIR     25 years ago   IR ANGIO INTRA EXTRACRAN SEL COM CAROTID INNOMINATE BILAT MOD SED  02/27/2021   IR ANGIO VERTEBRAL SEL SUBCLAVIAN INNOMINATE BILAT MOD SED  02/27/2021   IR RADIOLOGIST EVAL & MGMT  02/13/2021   IR RADIOLOGIST EVAL & MGMT  03/11/2021  IR US GUIDE VASC ACCESS RIGHT  02/27/2021   KNEE ARTHROSCOPY Left    PATCH ANGIOPLASTY Left 03/28/2021   Procedure: BOVINE PATCH ANGIOPLASTY;  Surgeon: Maeola Harman, MD;  Location: [facility] OR;  Service: Vascular;  Laterality: Left;   RADIOLOGY WITH ANESTHESIA N/A 02/27/2021   Procedure: STENT PLACEMENT;  Surgeon: Gilmer Mor, DO;  Location: [facility] OR;  Service: Anesthesiology;  Laterality: N/A;   TONSILLECTOMY     As a child   Social History   Social History Narrative   Not on file   Immunization History  Administered Date(s) Administered   Tdap 01/03/2009     Objective: Vital Signs: There were no vitals taken for this visit.   Physical Exam Vitals and nursing note reviewed.  Constitutional:      Appearance: He is well-developed.  HENT:     Head: Normocephalic and atraumatic.  Eyes:     Conjunctiva/sclera: Conjunctivae normal.     Pupils: Pupils are equal, round, and reactive to light.  Cardiovascular:     Rate and  Rhythm: Normal rate and regular rhythm.     Heart sounds: Normal heart sounds.  Pulmonary:     Effort: Pulmonary effort is normal.     Breath sounds: Normal breath sounds.  Abdominal:     General: Bowel sounds are normal.     Palpations: Abdomen is soft.  Musculoskeletal:     Cervical back: Normal range of motion and neck supple.  Skin:    General: Skin is warm and dry.     Capillary Refill: Capillary refill takes less than 2 seconds.  Neurological:     Mental Status: He is alert and oriented to person, place, and time.  Psychiatric:        Behavior: Behavior normal.      Musculoskeletal Exam: ***  CDAI Exam: CDAI Score: -- Patient Global: --; Provider Global: -- Swollen: --; Tender: -- Joint Exam 07/13/2023   No joint exam has been documented for this visit   There is currently no information documented on the homunculus. Go to the Rheumatology activity and complete the homunculus joint exam.  Investigation: No additional findings.  Imaging: XR KNEE 3 VIEW LEFT  Result Date: 06/16/2023 Mild medial compartment narrowing was noted.  Intercondylar osteophytes were noted.  Moderate patellofemoral narrowing was noted.  Postsurgical changes were noted. Impression: These findings are suggestive of mild osteoarthritis and moderate chondromalacia patella.  XR Hand 2 View Left  Result Date: 06/16/2023 CMC, PIP and DIP narrowing was noted.  No MCP, intercarpal or radiocarpal joint space narrowing was noted.  No erosive changes were noted.  Extra-articular calcification was noted next to fourth metacarpal shaft. Impression: These findings are suggestive of osteoarthritis of the hand.  XR Hand 2 View Right  Result Date: 06/16/2023 CMC, PIP and DIP narrowing was noted.  Mild second MCP narrowing was noted.  No intercarpal or radiocarpal joint space narrowing was noted.  An extra-articular foreign object was noted at the tip of the right fifth distal phalanx. Impression: These  findings suggestive of osteoarthritis of the hand.   Recent Labs: Lab Results  Component Value Date   WBC 6.2 06/15/2023   HGB 16.6 06/15/2023   PLT CANCELED 06/15/2023   NA 145 06/15/2023   K 4.1 06/15/2023   CL 108 06/15/2023   CO2 27 06/15/2023   GLUCOSE 101 (H) 06/15/2023   BUN 19 06/15/2023   CREATININE 1.18 06/15/2023   BILITOT 0.5 06/15/2023   ALKPHOS 93 03/26/2021  AST 17 06/15/2023   ALT 17 06/15/2023   PROT 6.2 06/15/2023   ALBUMIN 4.3 03/26/2021   CALCIUM 9.2 06/15/2023    Speciality Comments: No specialty comments available.  Procedures:  No procedures performed Allergies: Patient has no active allergies.   Assessment / Plan:     Visit Diagnoses: Bilateral carpal tunnel syndrome  S/P arthroscopic surgery of left knee  Elevated LFTs  Essential hypertension  History of COPD  Chronic ischemic heart disease  Pure hypercholesterolemia  History of type 2 diabetes mellitus  Left carotid stenosis  Tobacco dependence due to cigarettes  History of heart bypass surgery  H/O non-ST elevation myocardial infarction (NSTEMI)  RLS (restless legs syndrome)  Orders: No orders of the defined types were placed in this encounter.  No orders of the defined types were placed in this encounter.    Follow-Up Instructions: No follow-ups on file.   Gearldine Bienenstock, PA-C  Note - This record has been created using Dragon software.  Chart creation errors have been sought, but may not always  have been located. Such creation errors do not reflect on  the standard of medical care.

## 2023-07-13 ENCOUNTER — Encounter: Payer: Self-pay | Admitting: Physician Assistant

## 2023-07-13 ENCOUNTER — Ambulatory Visit: Payer: BC Managed Care – PPO | Attending: Physician Assistant | Admitting: Physician Assistant

## 2023-07-13 VITALS — BP 121/78 | HR 77 | Resp 16 | Ht 73.0 in | Wt 204.8 lb

## 2023-07-13 DIAGNOSIS — Z9889 Other specified postprocedural states: Secondary | ICD-10-CM | POA: Diagnosis not present

## 2023-07-13 DIAGNOSIS — M1712 Unilateral primary osteoarthritis, left knee: Secondary | ICD-10-CM

## 2023-07-13 DIAGNOSIS — Z951 Presence of aortocoronary bypass graft: Secondary | ICD-10-CM

## 2023-07-13 DIAGNOSIS — G5603 Carpal tunnel syndrome, bilateral upper limbs: Secondary | ICD-10-CM

## 2023-07-13 DIAGNOSIS — I259 Chronic ischemic heart disease, unspecified: Secondary | ICD-10-CM

## 2023-07-13 DIAGNOSIS — F17210 Nicotine dependence, cigarettes, uncomplicated: Secondary | ICD-10-CM

## 2023-07-13 DIAGNOSIS — G2581 Restless legs syndrome: Secondary | ICD-10-CM

## 2023-07-13 DIAGNOSIS — M19041 Primary osteoarthritis, right hand: Secondary | ICD-10-CM | POA: Diagnosis not present

## 2023-07-13 DIAGNOSIS — I10 Essential (primary) hypertension: Secondary | ICD-10-CM

## 2023-07-13 DIAGNOSIS — R7989 Other specified abnormal findings of blood chemistry: Secondary | ICD-10-CM

## 2023-07-13 DIAGNOSIS — E7800 Pure hypercholesterolemia, unspecified: Secondary | ICD-10-CM

## 2023-07-13 DIAGNOSIS — M19042 Primary osteoarthritis, left hand: Secondary | ICD-10-CM

## 2023-07-13 DIAGNOSIS — Z8709 Personal history of other diseases of the respiratory system: Secondary | ICD-10-CM

## 2023-07-13 DIAGNOSIS — Z8639 Personal history of other endocrine, nutritional and metabolic disease: Secondary | ICD-10-CM

## 2023-07-13 DIAGNOSIS — I252 Old myocardial infarction: Secondary | ICD-10-CM

## 2023-07-13 DIAGNOSIS — I6522 Occlusion and stenosis of left carotid artery: Secondary | ICD-10-CM

## 2023-07-13 NOTE — Patient Instructions (Addendum)
Arthritis compression gloves for hand pain   Carpal tunnel braces at night    Hand Exercises patient presents today Hand exercises can be helpful for almost anyone. They can strengthen your hands and improve flexibility and movement. The exercises can also increase blood flow to the hands. These results can make your work and daily tasks easier for you. Hand exercises can be especially helpful for people who have joint pain from arthritis or nerve damage from using their hands over and over. These exercises can also help people who injure a hand. Exercises Most of these hand exercises are gentle stretching and motion exercises. It is usually safe to do them often throughout the day. Warming up your hands before exercise may help reduce stiffness. You can do this with gentle massage or by placing your hands in warm water for 10-15 minutes. It is normal to feel some stretching, pulling, tightness, or mild discomfort when you begin new exercises. In time, this will improve. Remember to always be careful and stop right away if you feel sudden, very bad pain or your pain gets worse. You want to get better and be safe. Ask your health care provider which exercises are safe for you. Do exercises exactly as told by your provider and adjust them as told. Do not begin these exercises until told by your provider. Knuckle bend or "claw" fist  Stand or sit with your arm, hand, and all five fingers pointed straight up. Make sure to keep your wrist straight. Gently bend your fingers down toward your palm until the tips of your fingers are touching your palm. Keep your big knuckle straight and only bend the small knuckles in your fingers. Hold this position for 10 seconds. Straighten your fingers back to your starting position. Repeat this exercise 5-10 times with each hand. Full finger fist  Stand or sit with your arm, hand, and all five fingers pointed straight up. Make sure to keep your wrist  straight. Gently bend your fingers into your palm until the tips of your fingers are touching the middle of your palm. Hold this position for 10 seconds. Extend your fingers back to your starting position, stretching every joint fully. Repeat this exercise 5-10 times with each hand. Straight fist  Stand or sit with your arm, hand, and all five fingers pointed straight up. Make sure to keep your wrist straight. Gently bend your fingers at the big knuckle, where your fingers meet your hand, and at the middle knuckle. Keep the knuckle at the tips of your fingers straight and try to touch the bottom of your palm. Hold this position for 10 seconds. Extend your fingers back to your starting position, stretching every joint fully. Repeat this exercise 5-10 times with each hand. Tabletop  Stand or sit with your arm, hand, and all five fingers pointed straight up. Make sure to keep your wrist straight. Gently bend your fingers at the big knuckle, where your fingers meet your hand, as far down as you can. Keep the small knuckles in your fingers straight. Think of forming a tabletop with your fingers. Hold this position for 10 seconds. Extend your fingers back to your starting position, stretching every joint fully. Repeat this exercise 5-10 times with each hand. Finger spread  Place your hand flat on a table with your palm facing down. Make sure your wrist stays straight. Spread your fingers and thumb apart from each other as far as you can until you feel a gentle stretch. Hold this  position for 10 seconds. Bring your fingers and thumb tight together again. Hold this position for 10 seconds. Repeat this exercise 5-10 times with each hand. Making circles  Stand or sit with your arm, hand, and all five fingers pointed straight up. Make sure to keep your wrist straight. Make a circle by touching the tip of your thumb to the tip of your index finger. Hold for 10 seconds. Then open your hand  wide. Repeat this motion with your thumb and each of your fingers. Repeat this exercise 5-10 times with each hand. Thumb motion  Sit with your forearm resting on a table and your wrist straight. Your thumb should be facing up toward the ceiling. Keep your fingers relaxed as you move your thumb. Lift your thumb up as high as you can toward the ceiling. Hold for 10 seconds. Bend your thumb across your palm as far as you can, reaching the tip of your thumb for the small finger (pinkie) side of your palm. Hold for 10 seconds. Repeat this exercise 5-10 times with each hand. Grip strengthening  Hold a stress ball or other soft ball in the middle of your hand. Slowly increase the pressure, squeezing the ball as much as you can without causing pain. Think of bringing the tips of your fingers into the middle of your palm. All of your finger joints should bend when doing this exercise. Hold your squeeze for 10 seconds, then relax. Repeat this exercise 5-10 times with each hand. Contact a health care provider if: Your hand pain or discomfort gets much worse when you do an exercise. Your hand pain or discomfort does not improve within 2 hours after you exercise. If you have either of these problems, stop doing these exercises right away. Do not do them again unless your provider says that you can. Get help right away if: You develop sudden, severe hand pain or swelling. If this happens, stop doing these exercises right away. Do not do them again unless your provider says that you can. This information is not intended to replace advice given to you by your health care provider. Make sure you discuss any questions you have with your health care provider. Document Revised: 11/18/2022 Document Reviewed: 11/18/2022 Elsevier Patient Education  2024 [employer].

## 2023-07-14 DIAGNOSIS — D751 Secondary polycythemia: Secondary | ICD-10-CM | POA: Diagnosis not present

## 2023-07-14 DIAGNOSIS — E7801 Familial hypercholesterolemia: Secondary | ICD-10-CM | POA: Diagnosis not present

## 2023-07-14 DIAGNOSIS — I10 Essential (primary) hypertension: Secondary | ICD-10-CM | POA: Diagnosis not present

## 2023-07-14 DIAGNOSIS — Z0001 Encounter for general adult medical examination with abnormal findings: Secondary | ICD-10-CM | POA: Diagnosis not present

## 2023-08-06 ENCOUNTER — Ambulatory Visit (INDEPENDENT_AMBULATORY_CARE_PROVIDER_SITE_OTHER): Payer: BC Managed Care – PPO

## 2023-08-06 ENCOUNTER — Ambulatory Visit: Payer: BC Managed Care – PPO | Attending: Rheumatology | Admitting: Rheumatology

## 2023-08-06 DIAGNOSIS — M19042 Primary osteoarthritis, left hand: Secondary | ICD-10-CM | POA: Diagnosis not present

## 2023-08-06 DIAGNOSIS — M79641 Pain in right hand: Secondary | ICD-10-CM

## 2023-08-06 DIAGNOSIS — M79642 Pain in left hand: Secondary | ICD-10-CM

## 2023-08-06 DIAGNOSIS — M19041 Primary osteoarthritis, right hand: Secondary | ICD-10-CM

## 2023-08-06 NOTE — Progress Notes (Signed)
Patient was here to get the limited ultrasound examination of bilateral hands due to ongoing pain and discomfort in his hands.  Ultrasound examination of bilateral hands was performed per EULAR recommendations. Using 15 MHz transducer, grayscale and power Doppler bilateral second and third,  MCP joints  both dorsal and volar aspects were evaluated to look for synovitis or tenosynovitis. The findings were there was mild synovitis over the dorsal aspect of bilateral second MCPs  on ultrasound examination.   Impression: Ultrasound examination bilateral second MCP mild synovitis.  The findings were discussed with the patient.  Pollyann Savoy, MD

## 2023-08-17 NOTE — Progress Notes (Unsigned)
Office Visit Note  Patient: Jeffrey Matthews             Date of Birth: 1951-08-31           MRN: [id number]             PCP: Encarnacion Slates, PA-C Referring: Encarnacion Slates, PA-C Visit Date: 08/27/2023 Occupation: @GUAROCC @  Subjective:  Discuss medication options   History of Present Illness: Jeffrey Matthews is a 72 y.o. male with history of osteoarthritis.  Patient underwent an ultrasound of both hands on 08/06/23-mild synovitis involving bilateral 2nd MCP joints.  Patient presents today to discuss treatment options.    CBC and CMP updated on 06/15/23. He will return for updated lab work in 1 month, 3 months, then every 5 months. Standing orders for CBC and CMP released today.  Patient was counseled on the purpose, proper use, and adverse effects of hydroxychloroquine including nausea/diarrhea, skin rash, headaches, and sun sensitivity.  Advised patient to wear sunscreen once starting hydroxychloroquine to reduce risk of rash associated with sun sensitivity.  Discussed importance of annual eye exams while on hydroxychloroquine to monitor to ocular toxicity and discussed importance of frequent laboratory monitoring.  Provided patient with eye exam form for baseline ophthalmologic exam.  Provided patient with educational materials on hydroxychloroquine and answered all questions.  Patient consented to hydroxychloroquine. Will upload consent in the media tab.    Reviewed risk for QTC prolongation when used in combination with other QTc prolonging agents (including but not limited to antiarrhythmics, macrolide antibiotics, flouroquinolone antibiotics, haloperidol, quetiapine, olanzapine, risperidone, droperidol, ziprasidone, amitriptyline, citalopram, ondansetron, migraine triptans, and methadone). EKG shows QTc wnl on ***.  Dose will be {Plaquenil Dosing:22185}.  Prescription pending lab results.    Activities of Daily Living:  Patient reports morning stiffness for *** {minute/hour:19697}.    Patient {ACTIONS;DENIES/REPORTS:21021675::"Denies"} nocturnal pain.  Difficulty dressing/grooming: {ACTIONS;DENIES/REPORTS:21021675::"Denies"} Difficulty climbing stairs: {ACTIONS;DENIES/REPORTS:21021675::"Denies"} Difficulty getting out of chair: {ACTIONS;DENIES/REPORTS:21021675::"Denies"} Difficulty using hands for taps, buttons, cutlery, and/or writing: {ACTIONS;DENIES/REPORTS:21021675::"Denies"}  No Rheumatology ROS completed.   PMFS History:  Patient Active Problem List   Diagnosis Date Noted   Carotid artery stenosis 03/28/2021   Chronic ischemic heart disease 03/27/2021   H/O non-ST elevation myocardial infarction (NSTEMI) 03/27/2021   History of heart bypass surgery 03/27/2021   Left carotid stenosis 02/28/2021   Tobacco dependence due to cigarettes 02/12/2021   Elevated LFTs 05/11/2020   Diarrhea 05/11/2020    Past Medical History:  Diagnosis Date   Arthritis    Asthma    Complication of anesthesia    slow to wake up   COPD (chronic obstructive pulmonary disease) (HCC)    Coronary artery disease    Diabetes (HCC)    type 2   Fatty liver    Headache    Hepatitis    fatty liver   Hyperlipidemia    Hypertension    Myocardial infarction (HCC)    Restless legs    Stroke (HCC)     Family History  Problem Relation Age of Onset   Dementia Mother    Diabetes Mother    Aneurysm Father    Diabetes Sister        history of   Heart disease Sister    Other Sister        kidney and pancreas transplant   Hypertension Son    Kidney Stones Son    Kidney Stones Son    Gout Son    Colon cancer Neg Hx  Esophageal cancer Neg Hx    Past Surgical History:  Procedure Laterality Date   COLONOSCOPY     age 23 [facility]   CORONARY ARTERY BYPASS GRAFT  2018   ENDARTERECTOMY Left 03/28/2021   Procedure: LEFT CAROTID ENDARTERECTOMY;  Surgeon: Maeola Harman, MD;  Location: [facility] OR;  Service: Vascular;  Laterality: Left;   heart bypass     HERNIA REPAIR      25 years ago   IR ANGIO INTRA EXTRACRAN SEL COM CAROTID INNOMINATE BILAT MOD SED  02/27/2021   IR ANGIO VERTEBRAL SEL SUBCLAVIAN INNOMINATE BILAT MOD SED  02/27/2021   IR RADIOLOGIST EVAL & MGMT  02/13/2021   IR RADIOLOGIST EVAL & MGMT  03/11/2021   IR US GUIDE VASC ACCESS RIGHT  02/27/2021   KNEE ARTHROSCOPY Left    PATCH ANGIOPLASTY Left 03/28/2021   Procedure: BOVINE PATCH ANGIOPLASTY;  Surgeon: Maeola Harman, MD;  Location: [facility] OR;  Service: Vascular;  Laterality: Left;   RADIOLOGY WITH ANESTHESIA N/A 02/27/2021   Procedure: STENT PLACEMENT;  Surgeon: Gilmer Mor, DO;  Location: [facility] OR;  Service: Anesthesiology;  Laterality: N/A;   TONSILLECTOMY     As a child   Social History   Social History Narrative   Not on file   Immunization History  Administered Date(s) Administered   Tdap 01/03/2009     Objective: Vital Signs: There were no vitals taken for this visit.   Physical Exam Vitals and nursing note reviewed.  Constitutional:      Appearance: He is well-developed.  HENT:     Head: Normocephalic and atraumatic.  Eyes:     Conjunctiva/sclera: Conjunctivae normal.     Pupils: Pupils are equal, round, and reactive to light.  Cardiovascular:     Rate and Rhythm: Normal rate and regular rhythm.     Heart sounds: Normal heart sounds.  Pulmonary:     Effort: Pulmonary effort is normal.     Breath sounds: Normal breath sounds.  Abdominal:     General: Bowel sounds are normal.     Palpations: Abdomen is soft.  Musculoskeletal:     Cervical back: Normal range of motion and neck supple.  Skin:    General: Skin is warm and dry.     Capillary Refill: Capillary refill takes less than 2 seconds.  Neurological:     Mental Status: He is alert and oriented to person, place, and time.  Psychiatric:        Behavior: Behavior normal.      Musculoskeletal Exam: ***  CDAI Exam: CDAI Score: -- Patient Global: --; Provider Global: -- Swollen: --; Tender: -- Joint  Exam 08/27/2023   No joint exam has been documented for this visit   There is currently no information documented on the homunculus. Go to the Rheumatology activity and complete the homunculus joint exam.  Investigation: No additional findings.  Imaging: [place] LIMITED JOINT SPACE STRUCTURES UP BILAT  Result Date: 08/06/2023 Ultrasound examination of bilateral hands was performed per EULAR recommendations. Using 15 MHz transducer, grayscale and power Doppler bilateral second and third,  MCP joints  both dorsal and volar aspects were evaluated to look for synovitis or tenosynovitis. The findings were there was mild synovitis over the dorsal aspect of bilateral second MCPs  on ultrasound examination. Impression: Ultrasound examination bilateral second MCP mild synovitis.   Recent Labs: Lab Results  Component Value Date   WBC 6.2 06/15/2023   HGB 16.6 06/15/2023   PLT CANCELED 06/15/2023  NA 145 06/15/2023   K 4.1 06/15/2023   CL 108 06/15/2023   CO2 27 06/15/2023   GLUCOSE 101 (H) 06/15/2023   BUN 19 06/15/2023   CREATININE 1.18 06/15/2023   BILITOT 0.5 06/15/2023   ALKPHOS 93 03/26/2021   AST 17 06/15/2023   ALT 17 06/15/2023   PROT 6.2 06/15/2023   ALBUMIN 4.3 03/26/2021   CALCIUM 9.2 06/15/2023    Speciality Comments: No specialty comments available.  Procedures:  No procedures performed Allergies: Patient has no active allergies.   Assessment / Plan:     Visit Diagnoses: Primary osteoarthritis of both hands  Bilateral carpal tunnel syndrome  Primary osteoarthritis of left knee  S/P arthroscopic surgery of left knee  Elevated LFTs  Essential hypertension  History of COPD  Chronic ischemic heart disease  Pure hypercholesterolemia  History of type 2 diabetes mellitus  Left carotid stenosis  Tobacco dependence due to cigarettes  History of heart bypass surgery  H/O non-ST elevation myocardial infarction (NSTEMI)  RLS (restless legs  syndrome)  Orders: No orders of the defined types were placed in this encounter.  No orders of the defined types were placed in this encounter.   Face-to-face time spent with patient was *** minutes. Greater than 50% of time was spent in counseling and coordination of care.  Follow-Up Instructions: No follow-ups on file.   Gearldine Bienenstock, PA-C  Note - This record has been created using Dragon software.  Chart creation errors have been sought, but may not always  have been located. Such creation errors do not reflect on  the standard of medical care.

## 2023-08-27 ENCOUNTER — Encounter: Payer: Self-pay | Admitting: Physician Assistant

## 2023-08-27 ENCOUNTER — Telehealth: Payer: Self-pay

## 2023-08-27 ENCOUNTER — Ambulatory Visit: Payer: BC Managed Care – PPO | Attending: Physician Assistant | Admitting: Physician Assistant

## 2023-08-27 VITALS — BP 117/75 | HR 71 | Resp 14 | Ht 66.0 in | Wt 197.0 lb

## 2023-08-27 DIAGNOSIS — Z8709 Personal history of other diseases of the respiratory system: Secondary | ICD-10-CM

## 2023-08-27 DIAGNOSIS — G5603 Carpal tunnel syndrome, bilateral upper limbs: Secondary | ICD-10-CM | POA: Diagnosis not present

## 2023-08-27 DIAGNOSIS — I252 Old myocardial infarction: Secondary | ICD-10-CM

## 2023-08-27 DIAGNOSIS — Z79899 Other long term (current) drug therapy: Secondary | ICD-10-CM | POA: Diagnosis not present

## 2023-08-27 DIAGNOSIS — F17210 Nicotine dependence, cigarettes, uncomplicated: Secondary | ICD-10-CM

## 2023-08-27 DIAGNOSIS — G2581 Restless legs syndrome: Secondary | ICD-10-CM

## 2023-08-27 DIAGNOSIS — M19041 Primary osteoarthritis, right hand: Secondary | ICD-10-CM | POA: Diagnosis not present

## 2023-08-27 DIAGNOSIS — R7989 Other specified abnormal findings of blood chemistry: Secondary | ICD-10-CM

## 2023-08-27 DIAGNOSIS — M1990 Unspecified osteoarthritis, unspecified site: Secondary | ICD-10-CM | POA: Diagnosis not present

## 2023-08-27 DIAGNOSIS — I259 Chronic ischemic heart disease, unspecified: Secondary | ICD-10-CM

## 2023-08-27 DIAGNOSIS — I6522 Occlusion and stenosis of left carotid artery: Secondary | ICD-10-CM

## 2023-08-27 DIAGNOSIS — E7800 Pure hypercholesterolemia, unspecified: Secondary | ICD-10-CM

## 2023-08-27 DIAGNOSIS — M1712 Unilateral primary osteoarthritis, left knee: Secondary | ICD-10-CM

## 2023-08-27 DIAGNOSIS — Z8639 Personal history of other endocrine, nutritional and metabolic disease: Secondary | ICD-10-CM

## 2023-08-27 DIAGNOSIS — M19042 Primary osteoarthritis, left hand: Secondary | ICD-10-CM

## 2023-08-27 DIAGNOSIS — Z9889 Other specified postprocedural states: Secondary | ICD-10-CM

## 2023-08-27 DIAGNOSIS — Z951 Presence of aortocoronary bypass graft: Secondary | ICD-10-CM

## 2023-08-27 DIAGNOSIS — I10 Essential (primary) hypertension: Secondary | ICD-10-CM

## 2023-08-27 NOTE — Progress Notes (Signed)
Platelet count remains low but stable. Rest of Cbc stable.  CMP pending.

## 2023-08-27 NOTE — Patient Instructions (Signed)
Standing Labs We placed an order today for your standing lab work.   Please have your standing labs drawn in 1 month, 3 months and then every 5 months.   Please have your labs drawn 2 weeks prior to your appointment so that the provider can discuss your lab results at your appointment, if possible.  Please note that you may see your imaging and lab results in MyChart before we have reviewed them. We will contact you once all results are reviewed. Please allow our office up to 72 hours to thoroughly review all of the results before contacting the office for clarification of your results.  WALK-IN LAB HOURS  Monday through Thursday from 8:00 am -12:30 pm and 1:00 pm-5:00 pm and Friday from 8:00 am-12:00 pm.  Patients with office visits requiring labs will be seen before walk-in labs.  You may encounter longer than normal wait times. Please allow additional time. Wait times may be shorter on  Monday and Thursday afternoons.  We do not book appointments for walk-in labs. We appreciate your patience and understanding with our staff.   Labs are drawn by [facility]. Please bring your co-pay at the time of your lab draw.  You may receive a bill from [facility] for your lab work.  Please note if you are on Hydroxychloroquine and and an order has been placed for a Hydroxychloroquine level,  you will need to have it drawn 4 hours or more after your last dose.  If you wish to have your labs drawn at another location, please call the office 24 hours in advance so we can fax the orders.  The office is located at [address], [place], [place] [address]   If you have any questions regarding directions or hours of operation,  please call [phone].   As a reminder, please drink plenty of water prior to coming for your lab work. Thanks!     Hydroxychloroquine Tablets What is this medication? HYDROXYCHLOROQUINE (hye drox ee KLOR oh kwin) treats autoimmune conditions, such as rheumatoid  arthritis and lupus. It works by slowing down an overactive immune system. It may also be used to prevent and treat malaria. It works by killing the parasite that causes malaria. It belongs to a group of medications called DMARDs. This medicine may be used for other purposes; ask your health care provider or pharmacist if you have questions. COMMON BRAND NAME(S): Plaquenil, Quineprox, SOVUNA What should I tell my care team before I take this medication? They need to know if you have any of these conditions: Diabetes Eye disease, vision problems Frequently drink alcohol G6PD deficiency Heart disease Irregular heartbeat or rhythm Kidney disease Liver disease Porphyria Psoriasis An unusual or allergic reaction to hydroxychloroquine, other medications, foods, dyes, or preservatives Pregnant or trying to get pregnant Breastfeeding How should I use this medication? Take this medication by mouth with water. Take it as directed on the prescription label. Do not cut, crush, or chew this medication. Swallow the tablets whole. Take it with food. Do not take it more than directed. Take all of this medication unless your care team tells you to stop it early. Keep taking it even if you think you are better. Take products with antacids in them at a different time of day than this medication. Take this medication 4 hours before or 4 hours after antacids. Talk to your care team if you have questions. Talk to your care team about the use of this medication in children. While this  medication may be prescribed for selected conditions, precautions do apply. Overdosage: If you think you have taken too much of this medicine contact a poison control center or emergency room at once. NOTE: This medicine is only for you. Do not share this medicine with others. What if I miss a dose? If you miss a dose, take it as soon as you can. If it is almost time for your next dose, take only that dose. Do not take double or  extra doses. What may interact with this medication? Do not take this medication with any of the following: Cisapride Dronedarone Pimozide Thioridazine This medication may also interact with the following: Ampicillin Antacids Cimetidine Cyclosporine Digoxin Kaolin Medications for diabetes, such as insulin, glipizide, glyburide Medications for seizures, such as carbamazepine, phenobarbital, phenytoin Mefloquine Methotrexate Other medications that cause heart rhythm changes Praziquantel This list may not describe all possible interactions. Give your health care provider a list of all the medicines, herbs, non-prescription drugs, or dietary supplements you use. Also tell them if you smoke, drink alcohol, or use illegal drugs. Some items may interact with your medicine. What should I watch for while using this medication? Visit your care team for regular checks on your progress. Tell your care team if your symptoms do not start to get better or if they get worse. You may need blood work done while you are taking this medication. If you take other medications that can affect heart rhythm, you may need more testing. Talk to your care team if you have questions. Your vision may be tested before and during use of this medication. Tell your care team right away if you have any change in your eyesight. This medication may cause serious skin reactions. They can happen weeks to months after starting the medication. Contact your care team right away if you notice fevers or flu-like symptoms with a rash. The rash may be red or purple and then turn into blisters or peeling of the skin. Or, you might notice a red rash with swelling of the face, lips or lymph nodes in your neck or under your arms. If you or your family notice any changes in your behavior, such as new or worsening depression, thoughts of harming yourself, anxiety, or other unusual or disturbing thoughts, or memory loss, call your care team  right away. What side effects may I notice from receiving this medication? Side effects that you should report to your care team as soon as possible: Allergic reactions--skin rash, itching, hives, swelling of the face, lips, tongue, or throat Aplastic anemia--unusual weakness or fatigue, dizziness, headache, trouble breathing, increased bleeding or bruising Change in vision Heart rhythm changes--fast or irregular heartbeat, dizziness, feeling faint or lightheaded, chest pain, trouble breathing Infection--fever, chills, cough, or sore throat Low blood sugar (hypoglycemia)--tremors or shaking, anxiety, sweating, cold or clammy skin, confusion, dizziness, rapid heartbeat Muscle injury--unusual weakness or fatigue, muscle pain, dark yellow or brown urine, decrease in amount of urine Pain, tingling, or numbness in the hands or feet Rash, fever, and swollen lymph nodes Redness, blistering, peeling, or loosening of the skin, including inside the mouth Thoughts of suicide or self-harm, worsening mood, or feelings of depression Unusual bruising or bleeding Side effects that usually do not require medical attention (report to your care team if they continue or are bothersome): Diarrhea Headache Nausea Stomach pain Vomiting This list may not describe all possible side effects. Call your doctor for medical advice about side effects. You may report side effects to  FDA at 1-800-FDA-1088. Where should I keep my medication? Keep out of the reach of children and pets. Store at room temperature up to 30 degrees C (86 degrees F). Protect from light. Get rid of any unused medication after the expiration date. To get rid of medications that are no longer needed or have expired: Take the medication to a medication take-back program. Check with your pharmacy or law enforcement to find a location. If you cannot return the medication, check the label or package insert to see if the medication should be thrown out  in the garbage or flushed down the toilet. If you are not sure, ask your care team. If it is safe to put it in the trash, empty the medication out of the container. Mix the medication with cat litter, dirt, coffee grounds, or other unwanted substance. Seal the mixture in a bag or container. Put it in the trash. NOTE: This sheet is a summary. It may not cover all possible information. If you have questions about this medicine, talk to your doctor, pharmacist, or health care provider.  2024 Elsevier/Gold Standard (2022-05-12 00:00:00)

## 2023-08-27 NOTE — Telephone Encounter (Signed)
Pending lab results, patient will be starting plaquenil per Sherron Ales, PA-C. Patients pharmacy is [facility] in [place] on [address]. Thanks!   Consent obtained and sent to the scan center.

## 2023-08-28 ENCOUNTER — Other Ambulatory Visit: Payer: Self-pay | Admitting: *Deleted

## 2023-08-28 LAB — CBC WITH DIFFERENTIAL/PLATELET
Absolute Monocytes: 465 {cells}/uL (ref 200–950)
Basophils Absolute: 50 {cells}/uL (ref 0–200)
Basophils Relative: 0.8 %
Eosinophils Absolute: 508 {cells}/uL — ABNORMAL HIGH (ref 15–500)
Eosinophils Relative: 8.2 %
HCT: 54.2 % — ABNORMAL HIGH (ref 38.5–50.0)
Hemoglobin: 17.8 g/dL — ABNORMAL HIGH (ref 13.2–17.1)
Lymphs Abs: 1283 {cells}/uL (ref 850–3900)
MCH: 31.6 pg (ref 27.0–33.0)
MCHC: 32.8 g/dL (ref 32.0–36.0)
MCV: 96.1 fL (ref 80.0–100.0)
MPV: 10.9 fL (ref 7.5–12.5)
Monocytes Relative: 7.5 %
Neutro Abs: 3894 {cells}/uL (ref 1500–7800)
Neutrophils Relative %: 62.8 %
Platelets: 132 10*3/uL — ABNORMAL LOW (ref 140–400)
RBC: 5.64 10*6/uL (ref 4.20–5.80)
RDW: 12.2 % (ref 11.0–15.0)
Total Lymphocyte: 20.7 %
WBC: 6.2 10*3/uL (ref 3.8–10.8)

## 2023-08-28 LAB — COMPLETE METABOLIC PANEL WITH GFR
AG Ratio: 2.5 (calc) (ref 1.0–2.5)
ALT: 18 U/L (ref 9–46)
AST: 16 U/L (ref 10–35)
Albumin: 4.5 g/dL (ref 3.6–5.1)
Alkaline phosphatase (APISO): 86 U/L (ref 35–144)
BUN: 18 mg/dL (ref 7–25)
CO2: 27 mmol/L (ref 20–32)
Calcium: 9.5 mg/dL (ref 8.6–10.3)
Chloride: 104 mmol/L (ref 98–110)
Creat: 0.99 mg/dL (ref 0.70–1.28)
Globulin: 1.8 g/dL — ABNORMAL LOW (ref 1.9–3.7)
Glucose, Bld: 108 mg/dL — ABNORMAL HIGH (ref 65–99)
Potassium: 3.8 mmol/L (ref 3.5–5.3)
Sodium: 141 mmol/L (ref 135–146)
Total Bilirubin: 1 mg/dL (ref 0.2–1.2)
Total Protein: 6.3 g/dL (ref 6.1–8.1)
eGFR: 81 mL/min/{1.73_m2} (ref >=60)

## 2023-08-28 MED ORDER — HYDROXYCHLOROQUINE SULFATE 200 MG PO TABS: 200.0000 mg | ORAL_TABLET | Freq: Two times a day (BID) | ORAL | 0 refills | Status: DC

## 2023-08-28 NOTE — Progress Notes (Signed)
Glucose is 108. Globulin is borderline low. Rest of CMP WNL.

## 2023-09-07 DIAGNOSIS — M059 Rheumatoid arthritis with rheumatoid factor, unspecified: Secondary | ICD-10-CM | POA: Diagnosis not present

## 2023-09-08 DIAGNOSIS — Z1329 Encounter for screening for other suspected endocrine disorder: Secondary | ICD-10-CM | POA: Diagnosis not present

## 2023-09-08 DIAGNOSIS — E1165 Type 2 diabetes mellitus with hyperglycemia: Secondary | ICD-10-CM | POA: Diagnosis not present

## 2023-11-24 ENCOUNTER — Other Ambulatory Visit: Payer: Self-pay | Admitting: Physician Assistant

## 2023-11-24 NOTE — Telephone Encounter (Signed)
 Last Fill: 08/28/2023  Eye exam: not on file   Labs: 08/27/2023 Glucose is 108. Globulin is borderline low. Rest of CMP WNL.   Next Visit: Due November 2024. Message sent to the front to schedule.   Last Visit: 08/27/2023  IK:Rymnwpr inflammatory arthritis   Current Dose per office note 08/27/2023: Plaquenil  200 mg 1 tablet by mouth twice daily   Left message to advise patient he is due to update his labs, schedule a follow up visit and his PLQ eye exam.   Okay to refill Plaquenil ?

## 2023-11-25 ENCOUNTER — Telehealth: Payer: Self-pay | Admitting: *Deleted

## 2023-11-25 NOTE — Telephone Encounter (Signed)
 Patient contacted the office and left message stating he has an eye exam back in October and wants to know if he will need another one.   Attempted to contact patient and left message to advise patient he will need another eye exam if he has not had a visual field and an OCT done. Patient advised he will need this done as soon as possible.

## 2023-12-03 NOTE — Progress Notes (Deleted)
 Office Visit Note  Patient: Jeffrey Matthews             Date of Birth: [birth year]           MRN: [id number]             PCP: Encarnacion Slates, PA-C Referring: Encarnacion Slates, PA-C Visit Date: 12/17/2023 Occupation: @GUAROCC @  Subjective:  No chief complaint on file.   History of Present Illness: Jeffrey Matthews is a 73 y.o. male ***     Activities of Daily Living:  Patient reports morning stiffness for *** {minute/hour:19697}.   Patient {ACTIONS;DENIES/REPORTS:21021675::"Denies"} nocturnal pain.  Difficulty dressing/grooming: {ACTIONS;DENIES/REPORTS:21021675::"Denies"} Difficulty climbing stairs: {ACTIONS;DENIES/REPORTS:21021675::"Denies"} Difficulty getting out of chair: {ACTIONS;DENIES/REPORTS:21021675::"Denies"} Difficulty using hands for taps, buttons, cutlery, and/or writing: {ACTIONS;DENIES/REPORTS:21021675::"Denies"}  No Rheumatology ROS completed.   PMFS History:  Patient Active Problem List   Diagnosis Date Noted   Carotid artery stenosis 03/28/2021   Chronic ischemic heart disease 03/27/2021   H/O non-ST elevation myocardial infarction (NSTEMI) 03/27/2021   History of heart bypass surgery 03/27/2021   Left carotid stenosis 02/28/2021   Tobacco dependence due to cigarettes 02/12/2021   Elevated LFTs 05/11/2020   Diarrhea 05/11/2020    Past Medical History:  Diagnosis Date   Arthritis    Asthma    Complication of anesthesia    slow to wake up   COPD (chronic obstructive pulmonary disease) (HCC)    Coronary artery disease    Diabetes (HCC)    type 2   Fatty liver    Headache    Hepatitis    fatty liver   Hyperlipidemia    Hypertension    Myocardial infarction (HCC)    Restless legs    Stroke (HCC)     Family History  Problem Relation Age of Onset   Dementia Mother    Diabetes Mother    Aneurysm Father    Diabetes Sister        history of   Heart disease Sister    Other Sister        kidney and pancreas transplant   Hypertension Son    Kidney  Stones Son    Kidney Stones Son    Gout Son    Colon cancer Neg Hx    Esophageal cancer Neg Hx    Past Surgical History:  Procedure Laterality Date   COLONOSCOPY     age 43 [facility]   CORONARY ARTERY BYPASS GRAFT  2018   ENDARTERECTOMY Left 03/28/2021   Procedure: LEFT CAROTID ENDARTERECTOMY;  Surgeon: Maeola Harman, MD;  Location: [facility] OR;  Service: Vascular;  Laterality: Left;   heart bypass     HERNIA REPAIR     25 years ago   IR ANGIO INTRA EXTRACRAN SEL COM CAROTID INNOMINATE BILAT MOD SED  02/27/2021   IR ANGIO VERTEBRAL SEL SUBCLAVIAN INNOMINATE BILAT MOD SED  02/27/2021   IR RADIOLOGIST EVAL & MGMT  02/13/2021   IR RADIOLOGIST EVAL & MGMT  03/11/2021   IR US GUIDE VASC ACCESS RIGHT  02/27/2021   KNEE ARTHROSCOPY Left    PATCH ANGIOPLASTY Left 03/28/2021   Procedure: BOVINE PATCH ANGIOPLASTY;  Surgeon: Maeola Harman, MD;  Location: [facility] OR;  Service: Vascular;  Laterality: Left;   RADIOLOGY WITH ANESTHESIA N/A 02/27/2021   Procedure: STENT PLACEMENT;  Surgeon: Gilmer Mor, DO;  Location: [facility] OR;  Service: Anesthesiology;  Laterality: N/A;   TONSILLECTOMY     As a child   Social History  Social History Narrative   Not on file   Immunization History  Administered Date(s) Administered   Tdap 01/03/2009     Objective: Vital Signs: There were no vitals taken for this visit.   Physical Exam   Musculoskeletal Exam: ***  CDAI Exam: CDAI Score: -- Patient Global: --; Provider Global: -- Swollen: --; Tender: -- Joint Exam 12/17/2023   No joint exam has been documented for this visit   There is currently no information documented on the homunculus. Go to the Rheumatology activity and complete the homunculus joint exam.  Investigation: No additional findings.  Imaging: No results found.  Recent Labs: Lab Results  Component Value Date   WBC 6.2 08/27/2023   HGB 17.8 (H) 08/27/2023   PLT 132 (L) 08/27/2023   NA 141 08/27/2023   K  3.8 08/27/2023   CL 104 08/27/2023   CO2 27 08/27/2023   GLUCOSE 108 (H) 08/27/2023   BUN 18 08/27/2023   CREATININE 0.99 08/27/2023   BILITOT 1.0 08/27/2023   ALKPHOS 93 03/26/2021   AST 16 08/27/2023   ALT 18 08/27/2023   PROT 6.3 08/27/2023   ALBUMIN 4.3 03/26/2021   CALCIUM 9.5 08/27/2023    Speciality Comments: No specialty comments available.  Procedures:  No procedures performed Allergies: Patient has no active allergies.   Assessment / Plan:     Visit Diagnoses: Chronic inflammatory arthritis  High risk medication use  Primary osteoarthritis of both hands  Bilateral carpal tunnel syndrome  Primary osteoarthritis of left knee  S/P arthroscopic surgery of left knee  Elevated LFTs  Essential hypertension  History of COPD  Chronic ischemic heart disease  Pure hypercholesterolemia  History of type 2 diabetes mellitus  Left carotid stenosis  Tobacco dependence due to cigarettes  History of heart bypass surgery  H/O non-ST elevation myocardial infarction (NSTEMI)  RLS (restless legs syndrome)  Orders: No orders of the defined types were placed in this encounter.  No orders of the defined types were placed in this encounter.   Face-to-face time spent with patient was *** minutes. Greater than 50% of time was spent in counseling and coordination of care.  Follow-Up Instructions: No follow-ups on file.   Gearldine Bienenstock, PA-C  Note - This record has been created using Dragon software.  Chart creation errors have been sought, but may not always  have been located. Such creation errors do not reflect on  the standard of medical care.

## 2023-12-17 ENCOUNTER — Ambulatory Visit: Payer: BC Managed Care – PPO | Admitting: Physician Assistant

## 2023-12-17 DIAGNOSIS — F17210 Nicotine dependence, cigarettes, uncomplicated: Secondary | ICD-10-CM

## 2023-12-17 DIAGNOSIS — M19041 Primary osteoarthritis, right hand: Secondary | ICD-10-CM

## 2023-12-17 DIAGNOSIS — M1712 Unilateral primary osteoarthritis, left knee: Secondary | ICD-10-CM

## 2023-12-17 DIAGNOSIS — M1990 Unspecified osteoarthritis, unspecified site: Secondary | ICD-10-CM

## 2023-12-17 DIAGNOSIS — E7800 Pure hypercholesterolemia, unspecified: Secondary | ICD-10-CM

## 2023-12-17 DIAGNOSIS — I259 Chronic ischemic heart disease, unspecified: Secondary | ICD-10-CM

## 2023-12-17 DIAGNOSIS — I252 Old myocardial infarction: Secondary | ICD-10-CM

## 2023-12-17 DIAGNOSIS — Z79899 Other long term (current) drug therapy: Secondary | ICD-10-CM

## 2023-12-17 DIAGNOSIS — R7989 Other specified abnormal findings of blood chemistry: Secondary | ICD-10-CM

## 2023-12-17 DIAGNOSIS — Z951 Presence of aortocoronary bypass graft: Secondary | ICD-10-CM

## 2023-12-17 DIAGNOSIS — G2581 Restless legs syndrome: Secondary | ICD-10-CM

## 2023-12-17 DIAGNOSIS — I10 Essential (primary) hypertension: Secondary | ICD-10-CM

## 2023-12-17 DIAGNOSIS — I6522 Occlusion and stenosis of left carotid artery: Secondary | ICD-10-CM

## 2023-12-17 DIAGNOSIS — Z8639 Personal history of other endocrine, nutritional and metabolic disease: Secondary | ICD-10-CM

## 2023-12-17 DIAGNOSIS — Z8709 Personal history of other diseases of the respiratory system: Secondary | ICD-10-CM

## 2023-12-17 DIAGNOSIS — Z9889 Other specified postprocedural states: Secondary | ICD-10-CM

## 2023-12-17 DIAGNOSIS — G5603 Carpal tunnel syndrome, bilateral upper limbs: Secondary | ICD-10-CM

## 2023-12-24 ENCOUNTER — Other Ambulatory Visit: Payer: Self-pay | Admitting: Rheumatology

## 2024-01-04 DIAGNOSIS — E1165 Type 2 diabetes mellitus with hyperglycemia: Secondary | ICD-10-CM | POA: Diagnosis not present

## 2024-01-04 DIAGNOSIS — I10 Essential (primary) hypertension: Secondary | ICD-10-CM | POA: Diagnosis not present

## 2024-01-04 DIAGNOSIS — Z6827 Body mass index (BMI) 27.0-27.9, adult: Secondary | ICD-10-CM | POA: Diagnosis not present

## 2024-01-04 DIAGNOSIS — E119 Type 2 diabetes mellitus without complications: Secondary | ICD-10-CM | POA: Diagnosis not present

## 2024-01-04 DIAGNOSIS — D751 Secondary polycythemia: Secondary | ICD-10-CM | POA: Diagnosis not present

## 2024-01-04 DIAGNOSIS — E291 Testicular hypofunction: Secondary | ICD-10-CM | POA: Diagnosis not present

## 2024-01-16 ENCOUNTER — Other Ambulatory Visit: Payer: Self-pay | Admitting: Rheumatology
